# Patient Record
Sex: Male | Born: 1955 | Race: Black or African American | Hispanic: No | Smoking: Never smoker
Health system: Southern US, Community
[De-identification: ages and names within clinical notes are randomized; demographics above are authoritative.]

## PROBLEM LIST (undated history)

## (undated) DIAGNOSIS — Z524 Kidney donor: Secondary | ICD-10-CM

## (undated) DIAGNOSIS — T7840XA Allergy, unspecified, initial encounter: Secondary | ICD-10-CM

## (undated) HISTORY — DX: Allergy, unspecified, initial encounter: T78.40XA

## (undated) HISTORY — PX: NEPHRECTOMY: SHX65

## (undated) HISTORY — PX: TONSILLECTOMY: SUR1361

## (undated) HISTORY — PX: CERVICAL FUSION: SHX112

## (undated) HISTORY — DX: Kidney donor: Z52.4

---

## 1998-03-06 ENCOUNTER — Emergency Department (HOSPITAL_COMMUNITY): Admission: EM | Admit: 1998-03-06 | Discharge: 1998-03-06 | Payer: Self-pay | Admitting: Emergency Medicine

## 1999-07-02 ENCOUNTER — Encounter: Payer: Self-pay | Admitting: Emergency Medicine

## 1999-07-02 ENCOUNTER — Emergency Department (HOSPITAL_COMMUNITY): Admission: EM | Admit: 1999-07-02 | Discharge: 1999-07-03 | Payer: Self-pay | Admitting: Emergency Medicine

## 1999-07-03 ENCOUNTER — Encounter: Payer: Self-pay | Admitting: Emergency Medicine

## 2000-05-09 ENCOUNTER — Emergency Department (HOSPITAL_COMMUNITY): Admission: EM | Admit: 2000-05-09 | Discharge: 2000-05-09 | Payer: Self-pay | Admitting: Emergency Medicine

## 2000-08-12 ENCOUNTER — Encounter: Admission: RE | Admit: 2000-08-12 | Discharge: 2000-08-12 | Payer: Self-pay | Admitting: Emergency Medicine

## 2000-08-12 ENCOUNTER — Encounter: Payer: Self-pay | Admitting: Emergency Medicine

## 2002-05-01 ENCOUNTER — Encounter: Payer: Self-pay | Admitting: *Deleted

## 2002-05-01 ENCOUNTER — Ambulatory Visit (HOSPITAL_COMMUNITY): Admission: RE | Admit: 2002-05-01 | Discharge: 2002-05-01 | Payer: Self-pay | Admitting: *Deleted

## 2002-05-25 ENCOUNTER — Encounter: Payer: Self-pay | Admitting: *Deleted

## 2002-05-25 ENCOUNTER — Ambulatory Visit (HOSPITAL_COMMUNITY): Admission: RE | Admit: 2002-05-25 | Discharge: 2002-05-25 | Payer: Self-pay | Admitting: *Deleted

## 2002-06-19 ENCOUNTER — Emergency Department (HOSPITAL_COMMUNITY): Admission: EM | Admit: 2002-06-19 | Discharge: 2002-06-19 | Payer: Self-pay | Admitting: Emergency Medicine

## 2002-06-19 ENCOUNTER — Encounter: Payer: Self-pay | Admitting: Emergency Medicine

## 2004-08-21 ENCOUNTER — Ambulatory Visit (HOSPITAL_COMMUNITY): Admission: RE | Admit: 2004-08-21 | Discharge: 2004-08-21 | Payer: Self-pay | Admitting: Urology

## 2004-08-21 ENCOUNTER — Ambulatory Visit (HOSPITAL_BASED_OUTPATIENT_CLINIC_OR_DEPARTMENT_OTHER): Admission: RE | Admit: 2004-08-21 | Discharge: 2004-08-21 | Payer: Self-pay | Admitting: Urology

## 2005-10-10 ENCOUNTER — Encounter: Admission: RE | Admit: 2005-10-10 | Discharge: 2005-10-10 | Payer: Self-pay | Admitting: Emergency Medicine

## 2007-02-28 ENCOUNTER — Encounter: Admission: RE | Admit: 2007-02-28 | Discharge: 2007-02-28 | Payer: Self-pay | Admitting: Emergency Medicine

## 2007-03-13 ENCOUNTER — Encounter: Admission: RE | Admit: 2007-03-13 | Discharge: 2007-03-13 | Payer: Self-pay | Admitting: Emergency Medicine

## 2007-04-28 ENCOUNTER — Ambulatory Visit (HOSPITAL_COMMUNITY): Admission: RE | Admit: 2007-04-28 | Discharge: 2007-04-30 | Payer: Self-pay | Admitting: Neurosurgery

## 2007-06-19 ENCOUNTER — Encounter: Admission: RE | Admit: 2007-06-19 | Discharge: 2007-06-19 | Payer: Self-pay | Admitting: Neurosurgery

## 2009-06-02 ENCOUNTER — Emergency Department (HOSPITAL_COMMUNITY): Admission: EM | Admit: 2009-06-02 | Discharge: 2009-06-02 | Payer: Self-pay | Admitting: Emergency Medicine

## 2010-04-11 ENCOUNTER — Emergency Department (HOSPITAL_COMMUNITY): Admission: EM | Admit: 2010-04-11 | Discharge: 2010-04-12 | Payer: Self-pay | Admitting: Emergency Medicine

## 2010-09-28 LAB — URINALYSIS, ROUTINE W REFLEX MICROSCOPIC
Nitrite: NEGATIVE
Protein, ur: NEGATIVE mg/dL
Specific Gravity, Urine: 1.024 (ref 1.005–1.030)

## 2010-10-18 LAB — BASIC METABOLIC PANEL
BUN: 11 mg/dL (ref 6–23)
CO2: 25 mEq/L (ref 19–32)
Calcium: 9.2 mg/dL (ref 8.4–10.5)
Chloride: 103 mEq/L (ref 96–112)
Creatinine, Ser: 1.18 mg/dL (ref 0.4–1.5)
GFR calc Af Amer: >60 mL/min (ref >60)
GFR calc non Af Amer: >60 mL/min (ref >60)
Glucose, Bld: 92 mg/dL (ref 70–99)
Potassium: 5.4 mEq/L — ABNORMAL HIGH (ref 3.5–5.1)

## 2010-10-18 LAB — CBC
Platelets: 164 10*3/uL (ref 150–400)
RDW: 14.2 % (ref 11.5–15.5)

## 2010-10-18 LAB — POCT CARDIAC MARKERS
CKMB, poc: 2 ng/mL (ref 1.0–8.0)
Troponin i, poc: <0.05 ng/mL (ref 0.00–0.09)

## 2010-11-28 NOTE — Op Note (Signed)
Kevin Howell, Kevin Howell NO.:  [id number]   MEDICAL RECORD NO.:  [id number]          PATIENT TYPE:  INP   LOCATION:  2899                         FACILITY:  [facility]   PHYSICIAN:  Kathaleen Maser. Pool, M.D.    DATE OF BIRTH:  [birth year]   DATE OF PROCEDURE:  04/28/2007  DATE OF DISCHARGE:                               OPERATIVE REPORT   SERVICE:  Neurosurgery.   PREOPERATIVE DIAGNOSES:  Cervical stenosis with myelopathy.   POSTOPERATIVE DIAGNOSES:  Cervical stenosis with myelopathy.   PROCEDURE NOTE:  C3-4, C4-5, C5-6 and C6-7 anterior cervical diskectomy  and fusion with allograft and plating.   SURGEON:  Kathaleen Maser. Pool, M.D.   ASSISTANT:  Donalee Citrin, M.D.   ANESTHESIA:  General orotracheal.   INDICATIONS FOR PROCEDURE:  Kevin Howell is a 55 year old male with a  history of neck pain and some vague upper and lower extremity symptoms.  Workup demonstrates evidence of severe multilevel cervical stenosis with  spinal cord signal abnormality. The patient had been counseled as to his  options.  He decided to proceed with multilevel anterior cervical  decompression and fusion with instrumentation.   DESCRIPTION OF PROCEDURE:  The patient was placed on the operating table  in supine position. After an adequate level of anesthesia had been  achieved, the patient was positioned supine with the neck slightly  extended, held in place with halter traction.  The patient's anterior  cervical region was prepped and draped sterilely.  A 10 blade was used  to make a linear incision extending from C3 down to C7.  This was  carried down sharply to the platysma. The platysma was then divided  vertically and dissection proceeded on the medial border of the  sternomastoid muscle and carotid sheath. The trachea and esophagus were  mobilized and tracked towards the left. The prevertebral fascia was  stripped off the anterior spinal column.  The longus coli was then  elevated bilaterally  using electrocautery.  A deep self-retaining  retractor was placed. Intraoperative fluoroscopy views in the C3-4, 4-5,  5-6 and 6-7 levels were confirmed.  The disk space at all levels were  incised with a 15 blade in a rectangular fashion. Wide disk space clean  out achieved using the pituitary rongeurs, forward and backward Carlens  curettes, Kerrison rongeurs, high-speed drill. All elements of the disk  were removed down to the level of the posterior annulus.  Starting first  at C3-4, the remaining aspects of the annulus and osteophytes were  removed using the high-speed drill down to the level of the posterior  longitudinal ligament, the posterior longitudinal ligament was then  elevated and resected in the fashion using Kerrison rongeurs. A wide  central decompression was then performed by undercutting the bodies of  C3 and C4.  Decompression then proceeded out each neural foramen.  Wide  anterior foraminotomies were then performed along the course of the  exiting C4 nerve roots bilaterally.  At this point a very thorough  decompression had been achieved.  There was no evidence of injury to the  thecal sac or nerve  roots.  Decompression then performed at C4-5, C5-6  and C6-7 in a similar fashion and at all levels there was no evidence of  complicating features.  All levels were then irrigated clean.  Hemostasis was ensured with Gelfoam. LifeNet allograft wedge was then  placed at 3-4, 4-5, 5-6 and 67.  All grafts were then impacted into  place and recessed approximately 1 mm from the anterior cortical margin.  An 80 mm Atlantis anterior cervical plate was then placed over the C3-C7  levels.  This was then attached under fluoroscopic guidance using 13 mm  variable screws,  two each at all five levels.  All screws were given  final tightening and found to be solid and plumb.A locking screw was  engaged at all levels.  Final images revealed good position of the bone  grafts and hardware,   proper __________ of the lumbar spine. The wound  was then irrigated with antibiotic solution.  Gelfoam was placed  topically and  hemostasis found be good.  The wound was then closed in  typical fashion.  Steri-Strips and sterile dressing were applied. There  were  no complication. He tolerated the procedure well and he returns to  the recovery room postop.           ______________________________  Kathaleen Maser. Pool, M.D.     HAP/MEDQ  D:  04/28/2007  T:  04/28/2007  Job:  [id number]

## 2010-12-01 NOTE — Op Note (Signed)
NAME:  Kevin Howell, Kevin Howell NO.:  [id number]   MEDICAL RECORD NO.:  [id number]          PATIENT TYPE:  AMB   LOCATION:  [facility]                         FACILITY:  [facility]   PHYSICIAN:  Bertram Millard. Dahlstedt, M.D.DATE OF BIRTH:  1956/05/29   DATE OF PROCEDURE:  08/21/2004  DATE OF DISCHARGE:                                 OPERATIVE REPORT   PREOPERATIVE DIAGNOSIS:  Phimosis.   POSTOPERATIVE DIAGNOSIS:  Phimosis.   PROCEDURE:  Circumcision.   SURGEON:  Bertram Millard. Dahlstedt, M.D.   ANESTHESIA:  General.   COMPLICATIONS:  None.   BRIEF HISTORY:  A 55 year old male who has presented with some difficulty  with his foreskin over the past several months. He has been treated with  topical steroids. Despite this, he says mild phimosis but also recurrent  balanitis. He presents at this time for circumcision at his request. He is  aware of risks and complications and desires to proceed.   DESCRIPTION OF PROCEDURE:  The patient was administered preoperative IV  antibiotics and taken to the operating room where general anesthetic was  administered. His genitalia were prepped and draped. A 15 mL of 0.5% plain  Marcaine was used to perform a dorsal penile block. Proximal and distal  circumcising incisions were then made and the foreskin was excised. Small  bleeders underneath were electrocoagulated. The incision was closed. First,  a U stitch was placed at the frenulum. Quadrant sutures of the same 4-0  chromic were placed in simple fashion. In between the 4-0 chromic was used  to run the wound closed in a simple running fashion. The usual dressings  were placed.   The patient tolerated procedure well. He was awakened and  taken to PACU in  stable condition.   He will follow up in two to three weeks. Discharge instructions were given  as well as prescription for Vicodin #20.      SMD/MEDQ  D:  08/21/2004  T:  08/21/2004  Job:  [id number]

## 2011-04-26 LAB — DIFFERENTIAL
Lymphocytes Relative: 24
Lymphs Abs: 1.7
Monocytes Absolute: 0.5
Monocytes Relative: 7
Neutro Abs: 4.5
Neutrophils Relative %: 64

## 2011-04-26 LAB — CBC
MCHC: 33.1
Platelets: 182
RDW: 14.3 — ABNORMAL HIGH

## 2011-04-26 LAB — TYPE AND SCREEN: ABO/RH(D): A POS

## 2011-04-26 LAB — ABO/RH: ABO/RH(D): A POS

## 2011-11-23 ENCOUNTER — Other Ambulatory Visit: Payer: Self-pay | Admitting: Neurosurgery

## 2011-11-23 DIAGNOSIS — M545 Low back pain, unspecified: Secondary | ICD-10-CM

## 2011-11-28 ENCOUNTER — Ambulatory Visit
Admission: RE | Admit: 2011-11-28 | Discharge: 2011-11-28 | Disposition: A | Discharge status: Home / Self Care | Payer: 59 | Source: Ambulatory Visit | Attending: Neurosurgery | Admitting: Neurosurgery

## 2011-11-28 DIAGNOSIS — M545 Low back pain, unspecified: Secondary | ICD-10-CM

## 2012-01-08 ENCOUNTER — Other Ambulatory Visit: Payer: Self-pay | Admitting: Neurosurgery

## 2012-02-19 ENCOUNTER — Encounter (HOSPITAL_COMMUNITY): Payer: Self-pay | Admitting: Pharmacy Technician

## 2012-02-19 ENCOUNTER — Encounter (HOSPITAL_COMMUNITY)
Admission: RE | Admit: 2012-02-19 | Discharge: 2012-02-19 | Disposition: A | Discharge status: Home / Self Care | Payer: 59 | Source: Ambulatory Visit | Attending: Neurosurgery | Admitting: Neurosurgery

## 2012-02-19 ENCOUNTER — Encounter (HOSPITAL_COMMUNITY): Payer: Self-pay

## 2012-02-19 LAB — CBC
Hemoglobin: 15.8 g/dL (ref 13.0–17.0)
MCHC: 34.1 g/dL (ref 30.0–36.0)
WBC: 6.7 10*3/uL (ref 4.0–10.5)

## 2012-02-19 LAB — BASIC METABOLIC PANEL
BUN: 8 mg/dL (ref 6–23)
CO2: 28 mEq/L (ref 19–32)
Chloride: 103 mEq/L (ref 96–112)
Creatinine, Ser: 1.31 mg/dL (ref 0.50–1.35)
Glucose, Bld: 90 mg/dL (ref 70–99)

## 2012-02-19 LAB — TYPE AND SCREEN: Antibody Screen: NEGATIVE

## 2012-02-19 LAB — DIFFERENTIAL
Basophils Absolute: 0 10*3/uL (ref 0.0–0.1)
Basophils Relative: 0 % (ref 0–1)
Monocytes Relative: 7 % (ref 3–12)
Neutro Abs: 4.1 10*3/uL (ref 1.7–7.7)
Neutrophils Relative %: 61 % (ref 43–77)

## 2012-02-19 LAB — SURGICAL PCR SCREEN: MRSA, PCR: NEGATIVE

## 2012-02-19 NOTE — Pre-Procedure Instructions (Signed)
20 [name]  02/19/2012   Your procedure is scheduled on:  Tuesday  03/04/12  Report to [facility] at 530 AM.  Call this number if you have problems the morning of surgery: [phone]   Remember:   Do not eat food OR DRINK:After Midnight.   Take these medicines the morning of surgery with A SIP OF WATER:   NONE   Do not wear jewelry, make-up or nail polish.  Do not wear lotions, powders, or perfumes. You may wear deodorant.  Do not shave 48 hours prior to surgery. Men may shave face and neck.  Do not bring valuables to the hospital.  Contacts, dentures or bridgework may not be worn into surgery.  Leave suitcase in the car. After surgery it may be brought to your room.  For patients admitted to the hospital, checkout time is 11:00 AM the day of discharge.   Patients discharged the day of surgery will not be allowed to drive home.  Name and phone number of your driver:   Special Instructions: CHG Shower Use Special Wash: 1/2 bottle night before surgery and 1/2 bottle morning of surgery.   Please read over the following fact sheets that you were given: Pain Booklet, Coughing and Deep Breathing, MRSA Information and Surgical Site Infection Prevention

## 2012-03-03 MED ORDER — DEXAMETHASONE SODIUM PHOSPHATE 10 MG/ML IJ SOLN: 10.0000 mg | INTRAMUSCULAR | Status: DC

## 2012-03-03 MED ORDER — DEXTROSE 5 % IV SOLN
2.0000 g | INTRAVENOUS | Status: AC
  Administered 2012-03-04: 2 g via INTRAVENOUS

## 2012-03-04 ENCOUNTER — Encounter (HOSPITAL_COMMUNITY): Payer: Self-pay | Admitting: Anesthesiology

## 2012-03-04 ENCOUNTER — Ambulatory Visit (HOSPITAL_COMMUNITY): Payer: 59

## 2012-03-04 ENCOUNTER — Encounter (HOSPITAL_COMMUNITY)
Admission: RE | Disposition: A | Discharge status: Home / Self Care | Payer: Self-pay | Source: Ambulatory Visit | Attending: Neurosurgery

## 2012-03-04 ENCOUNTER — Encounter (HOSPITAL_COMMUNITY): Payer: Self-pay | Admitting: *Deleted

## 2012-03-04 ENCOUNTER — Ambulatory Visit (HOSPITAL_COMMUNITY): Payer: 59 | Admitting: Anesthesiology

## 2012-03-04 ENCOUNTER — Encounter (HOSPITAL_COMMUNITY): Payer: Self-pay | Admitting: Neurosurgery

## 2012-03-04 ENCOUNTER — Ambulatory Visit (HOSPITAL_COMMUNITY)
Admission: RE | Admit: 2012-03-04 | Discharge: 2012-03-05 | Disposition: A | Discharge status: Home / Self Care | Payer: 59 | Source: Ambulatory Visit | Attending: Neurosurgery | Admitting: Neurosurgery

## 2012-03-04 DIAGNOSIS — Z981 Arthrodesis status: Secondary | ICD-10-CM | POA: Insufficient documentation

## 2012-03-04 DIAGNOSIS — Z01812 Encounter for preprocedural laboratory examination: Secondary | ICD-10-CM | POA: Insufficient documentation

## 2012-03-04 DIAGNOSIS — M48062 Spinal stenosis, lumbar region with neurogenic claudication: Secondary | ICD-10-CM | POA: Diagnosis present

## 2012-03-04 HISTORY — PX: LUMBAR LAMINECTOMY/DECOMPRESSION MICRODISCECTOMY: SHX5026

## 2012-03-04 SURGERY — LUMBAR LAMINECTOMY/DECOMPRESSION MICRODISCECTOMY 2 LEVELS
Anesthesia: General | Site: Back | Wound class: Clean

## 2012-03-04 MED ORDER — ACETAMINOPHEN 325 MG PO TABS: 650.0000 mg | ORAL_TABLET | ORAL | Status: DC | PRN

## 2012-03-04 MED ORDER — HYDROCODONE-ACETAMINOPHEN 5-325 MG PO TABS: 1.0000 | ORAL_TABLET | ORAL | Status: DC | PRN

## 2012-03-04 MED ORDER — BACITRACIN 50000 UNITS IM SOLR
INTRAMUSCULAR | Status: AC
  Filled 2012-03-04: qty 1

## 2012-03-04 MED ORDER — CYCLOBENZAPRINE HCL 10 MG PO TABS
10.0000 mg | ORAL_TABLET | Freq: Three times a day (TID) | ORAL | Status: DC | PRN
  Administered 2012-03-05: 10 mg via ORAL
  Filled 2012-03-04: qty 1

## 2012-03-04 MED ORDER — LACTATED RINGERS IV SOLN
INTRAVENOUS | Status: DC | PRN
  Administered 2012-03-04 (×2): via INTRAVENOUS

## 2012-03-04 MED ORDER — SENNA 8.6 MG PO TABS
1.0000 | ORAL_TABLET | Freq: Two times a day (BID) | ORAL | Status: DC
  Administered 2012-03-04 – 2012-03-05 (×2): 8.6 mg via ORAL
  Filled 2012-03-04 (×3): qty 1

## 2012-03-04 MED ORDER — CEFAZOLIN SODIUM-DEXTROSE 2-3 GM-% IV SOLR
INTRAVENOUS | Status: AC
  Filled 2012-03-04: qty 50

## 2012-03-04 MED ORDER — FENTANYL CITRATE 0.05 MG/ML IJ SOLN
INTRAMUSCULAR | Status: DC | PRN
  Administered 2012-03-04: 50 ug via INTRAVENOUS
  Administered 2012-03-04: 100 ug via INTRAVENOUS

## 2012-03-04 MED ORDER — ZOLPIDEM TARTRATE 5 MG PO TABS: 5.0000 mg | ORAL_TABLET | Freq: Every evening | ORAL | Status: DC | PRN

## 2012-03-04 MED ORDER — ACETAMINOPHEN 650 MG RE SUPP: 650.0000 mg | RECTAL | Status: DC | PRN

## 2012-03-04 MED ORDER — BUPIVACAINE HCL (PF) 0.25 % IJ SOLN
INTRAMUSCULAR | Status: DC | PRN
  Administered 2012-03-04: 20 mL

## 2012-03-04 MED ORDER — ACETAMINOPHEN 10 MG/ML IV SOLN
INTRAVENOUS | Status: AC
  Filled 2012-03-04: qty 100

## 2012-03-04 MED ORDER — 0.9 % SODIUM CHLORIDE (POUR BTL) OPTIME
TOPICAL | Status: DC | PRN
  Administered 2012-03-04: 1000 mL

## 2012-03-04 MED ORDER — NEOSTIGMINE METHYLSULFATE 1 MG/ML IJ SOLN
INTRAMUSCULAR | Status: DC | PRN
  Administered 2012-03-04: 5 mg via INTRAVENOUS

## 2012-03-04 MED ORDER — HYDROMORPHONE HCL PF 1 MG/ML IJ SOLN
INTRAMUSCULAR | Status: AC
  Filled 2012-03-04: qty 1

## 2012-03-04 MED ORDER — MIDAZOLAM HCL 5 MG/5ML IJ SOLN
INTRAMUSCULAR | Status: DC | PRN
  Administered 2012-03-04: 2 mg via INTRAVENOUS

## 2012-03-04 MED ORDER — ONDANSETRON HCL 4 MG/2ML IJ SOLN: 4.0000 mg | INTRAMUSCULAR | Status: DC | PRN

## 2012-03-04 MED ORDER — SODIUM CHLORIDE 0.9 % IJ SOLN: 3.0000 mL | INTRAMUSCULAR | Status: DC | PRN

## 2012-03-04 MED ORDER — DEXAMETHASONE SODIUM PHOSPHATE 10 MG/ML IJ SOLN
INTRAMUSCULAR | Status: AC
  Administered 2012-03-04: 10 mg via INTRAVENOUS
  Filled 2012-03-04: qty 1

## 2012-03-04 MED ORDER — THROMBIN 5000 UNITS EX KIT
PACK | CUTANEOUS | Status: DC | PRN
  Administered 2012-03-04 (×2): 5000 [IU] via TOPICAL

## 2012-03-04 MED ORDER — ROCURONIUM BROMIDE 100 MG/10ML IV SOLN
INTRAVENOUS | Status: DC | PRN
  Administered 2012-03-04: 50 mg via INTRAVENOUS
  Administered 2012-03-04: 10 mg via INTRAVENOUS

## 2012-03-04 MED ORDER — ONDANSETRON HCL 4 MG/2ML IJ SOLN
INTRAMUSCULAR | Status: DC | PRN
  Administered 2012-03-04: 4 mg via INTRAVENOUS

## 2012-03-04 MED ORDER — MENTHOL 3 MG MT LOZG: 1.0000 | LOZENGE | OROMUCOSAL | Status: DC | PRN

## 2012-03-04 MED ORDER — ACETAMINOPHEN 10 MG/ML IV SOLN
INTRAVENOUS | Status: DC | PRN
  Administered 2012-03-04: 1000 mg via INTRAVENOUS

## 2012-03-04 MED ORDER — HYDROMORPHONE HCL PF 1 MG/ML IJ SOLN
0.2500 mg | INTRAMUSCULAR | Status: DC | PRN
  Administered 2012-03-04 (×4): 0.5 mg via INTRAVENOUS

## 2012-03-04 MED ORDER — SODIUM CHLORIDE 0.9 % IJ SOLN
3.0000 mL | Freq: Two times a day (BID) | INTRAMUSCULAR | Status: DC
  Administered 2012-03-04 – 2012-03-05 (×3): 3 mL via INTRAVENOUS

## 2012-03-04 MED ORDER — LIDOCAINE HCL (CARDIAC) 20 MG/ML IV SOLN
INTRAVENOUS | Status: DC | PRN
  Administered 2012-03-04: 80 mg via INTRAVENOUS

## 2012-03-04 MED ORDER — PROPOFOL 10 MG/ML IV EMUL
INTRAVENOUS | Status: DC | PRN
  Administered 2012-03-04: 200 mg via INTRAVENOUS

## 2012-03-04 MED ORDER — HEMOSTATIC AGENTS (NO CHARGE) OPTIME
TOPICAL | Status: DC | PRN
  Administered 2012-03-04: 1 via TOPICAL

## 2012-03-04 MED ORDER — ASPIRIN EC 325 MG PO TBEC
325.0000 mg | DELAYED_RELEASE_TABLET | Freq: Every day | ORAL | Status: DC | PRN
  Filled 2012-03-04: qty 1

## 2012-03-04 MED ORDER — CEFAZOLIN SODIUM 1-5 GM-% IV SOLN
1.0000 g | Freq: Three times a day (TID) | INTRAVENOUS | Status: AC
  Administered 2012-03-04 (×2): 1 g via INTRAVENOUS
  Filled 2012-03-04 (×2): qty 50

## 2012-03-04 MED ORDER — ALUM & MAG HYDROXIDE-SIMETH 200-200-20 MG/5ML PO SUSP: 30.0000 mL | Freq: Four times a day (QID) | ORAL | Status: DC | PRN

## 2012-03-04 MED ORDER — ONDANSETRON HCL 4 MG/2ML IJ SOLN: 4.0000 mg | Freq: Once | INTRAMUSCULAR | Status: DC | PRN

## 2012-03-04 MED ORDER — PHENOL 1.4 % MT LIQD: 1.0000 | OROMUCOSAL | Status: DC | PRN

## 2012-03-04 MED ORDER — GLYCOPYRROLATE 0.2 MG/ML IJ SOLN
INTRAMUSCULAR | Status: DC | PRN
  Administered 2012-03-04: .8 mg via INTRAVENOUS

## 2012-03-04 MED ORDER — SODIUM CHLORIDE 0.9 % IR SOLN
Status: DC | PRN
  Administered 2012-03-04: 09:00:00

## 2012-03-04 MED ORDER — SODIUM CHLORIDE 0.9 % IV SOLN
INTRAVENOUS | Status: AC
  Filled 2012-03-04: qty 500

## 2012-03-04 MED ORDER — SODIUM CHLORIDE 0.9 % IV SOLN: 250.0000 mL | INTRAVENOUS | Status: DC

## 2012-03-04 MED ORDER — HYDROMORPHONE HCL PF 1 MG/ML IJ SOLN
0.5000 mg | INTRAMUSCULAR | Status: DC | PRN
  Administered 2012-03-04: 1 mg via INTRAVENOUS
  Filled 2012-03-04: qty 1

## 2012-03-04 MED ORDER — OXYCODONE-ACETAMINOPHEN 5-325 MG PO TABS
1.0000 | ORAL_TABLET | ORAL | Status: DC | PRN
  Administered 2012-03-04: 2 via ORAL
  Administered 2012-03-05: 1 via ORAL
  Filled 2012-03-04: qty 1
  Filled 2012-03-04: qty 2

## 2012-03-04 SURGICAL SUPPLY — 49 items
ADH SKN CLS APL DERMABOND .7 (GAUZE/BANDAGES/DRESSINGS) ×1
APL SKNCLS STERI-STRIP NONHPOA (GAUZE/BANDAGES/DRESSINGS) ×1
BAG DECANTER FOR FLEXI CONT (MISCELLANEOUS) ×2 IMPLANT
BENZOIN TINCTURE PRP APPL 2/3 (GAUZE/BANDAGES/DRESSINGS) ×2 IMPLANT
BLADE SURG ROTATE 9660 (MISCELLANEOUS) IMPLANT
BRUSH SCRUB EZ PLAIN DRY (MISCELLANEOUS) ×2 IMPLANT
BUR CUTTER 7.0 ROUND (BURR) ×2 IMPLANT
CANISTER SUCTION 2500CC (MISCELLANEOUS) ×2 IMPLANT
CLOTH BEACON ORANGE TIMEOUT ST (SAFETY) ×2 IMPLANT
CONT SPEC 4OZ CLIKSEAL STRL BL (MISCELLANEOUS) ×2 IMPLANT
DECANTER SPIKE VIAL GLASS SM (MISCELLANEOUS) ×1 IMPLANT
DERMABOND ADVANCED (GAUZE/BANDAGES/DRESSINGS) ×1
DERMABOND ADVANCED .7 DNX12 (GAUZE/BANDAGES/DRESSINGS) ×1 IMPLANT
DRAPE LAPAROTOMY 100X72X124 (DRAPES) ×2 IMPLANT
DRAPE MICROSCOPE ZEISS OPMI (DRAPES) ×2 IMPLANT
DRAPE POUCH INSTRU U-SHP 10X18 (DRAPES) ×2 IMPLANT
DRAPE PROXIMA HALF (DRAPES) IMPLANT
DRAPE SURG 17X23 STRL (DRAPES) ×4 IMPLANT
ELECT REM PT RETURN 9FT ADLT (ELECTROSURGICAL) ×2
ELECTRODE REM PT RTRN 9FT ADLT (ELECTROSURGICAL) ×1 IMPLANT
GAUZE SPONGE 4X4 16PLY XRAY LF (GAUZE/BANDAGES/DRESSINGS) IMPLANT
GLOVE BIOGEL PI IND STRL 7.0 (GLOVE) IMPLANT
GLOVE BIOGEL PI INDICATOR 7.0 (GLOVE) ×2
GLOVE ECLIPSE 6.5 STRL STRAW (GLOVE) ×1 IMPLANT
GLOVE ECLIPSE 8.5 STRL (GLOVE) ×2 IMPLANT
GLOVE EXAM NITRILE LRG STRL (GLOVE) IMPLANT
GLOVE EXAM NITRILE MD LF STRL (GLOVE) IMPLANT
GLOVE EXAM NITRILE XL STR (GLOVE) IMPLANT
GLOVE EXAM NITRILE XS STR PU (GLOVE) IMPLANT
GOWN BRE IMP SLV AUR LG STRL (GOWN DISPOSABLE) ×1 IMPLANT
GOWN BRE IMP SLV AUR XL STRL (GOWN DISPOSABLE) ×2 IMPLANT
GOWN STRL REIN 2XL LVL4 (GOWN DISPOSABLE) IMPLANT
KIT BASIN OR (CUSTOM PROCEDURE TRAY) ×2 IMPLANT
KIT ROOM TURNOVER OR (KITS) ×2 IMPLANT
NEEDLE HYPO 22GX1.5 SAFETY (NEEDLE) ×2 IMPLANT
NS IRRIG 1000ML POUR BTL (IV SOLUTION) ×2 IMPLANT
PACK LAMINECTOMY NEURO (CUSTOM PROCEDURE TRAY) ×2 IMPLANT
PAD ARMBOARD 7.5X6 YLW CONV (MISCELLANEOUS) ×6 IMPLANT
RUBBERBAND STERILE (MISCELLANEOUS) ×4 IMPLANT
SPONGE GAUZE 4X4 12PLY (GAUZE/BANDAGES/DRESSINGS) ×2 IMPLANT
SPONGE SURGIFOAM ABS GEL SZ50 (HEMOSTASIS) ×2 IMPLANT
STRIP CLOSURE SKIN 1/2X4 (GAUZE/BANDAGES/DRESSINGS) ×2 IMPLANT
SUT VIC AB 2-0 CT1 18 (SUTURE) ×2 IMPLANT
SUT VIC AB 3-0 SH 8-18 (SUTURE) ×2 IMPLANT
SYR 20ML ECCENTRIC (SYRINGE) ×2 IMPLANT
TAPE CLOTH SURG 4X10 WHT LF (GAUZE/BANDAGES/DRESSINGS) ×1 IMPLANT
TOWEL OR 17X24 6PK STRL BLUE (TOWEL DISPOSABLE) ×2 IMPLANT
TOWEL OR 17X26 10 PK STRL BLUE (TOWEL DISPOSABLE) ×2 IMPLANT
WATER STERILE IRR 1000ML POUR (IV SOLUTION) ×2 IMPLANT

## 2012-03-04 NOTE — Anesthesia Preprocedure Evaluation (Addendum)
Anesthesia Evaluation  Patient identified by MRN, date of birth, ID band Patient awake    Reviewed: Allergy & Precautions, H&P , NPO status , Patient's Chart, lab work & pertinent test results, reviewed documented beta blocker date and time   History of Anesthesia Complications Negative for: history of anesthetic complications  Airway Mallampati: II TM Distance: >3 FB Neck ROM: Limited    Dental  (+) Teeth Intact, Dental Advisory Given and Caps,    Pulmonary neg pulmonary ROS,          Cardiovascular negative cardio ROS      Neuro/Psych negative neurological ROS  negative psych ROS   GI/Hepatic negative GI ROS, Neg liver ROS,   Endo/Other  negative endocrine ROS  Renal/GU negative Renal ROS     Musculoskeletal negative musculoskeletal ROS (+)   Abdominal   Peds  Hematology negative hematology ROS (+)   Anesthesia Other Findings   Reproductive/Obstetrics                          Anesthesia Physical Anesthesia Plan  ASA: II  Anesthesia Plan: General   Post-op Pain Management:    Induction: Intravenous  Airway Management Planned: Oral ETT  Additional Equipment:   Intra-op Plan:   Post-operative Plan: Extubation in OR  Informed Consent: I have reviewed the patients History and Physical, chart, labs and discussed the procedure including the risks, benefits and alternatives for the proposed anesthesia with the patient or authorized representative who has indicated his/her understanding and acceptance.     Plan Discussed with: CRNA and Surgeon  Anesthesia Plan Comments:         Anesthesia Quick Evaluation

## 2012-03-04 NOTE — Plan of Care (Signed)
Problem: Consults Goal: Diagnosis - Spinal Surgery Outcome: Completed/Met Date Met:  03/04/12 Lumbar Laminectomy (Complex)

## 2012-03-04 NOTE — Transfer of Care (Signed)
Immediate Anesthesia Transfer of Care Note  Patient: Kevin Howell  Procedure(s) Performed: Procedure(s) (LRB): LUMBAR LAMINECTOMY/DECOMPRESSION MICRODISCECTOMY 2 LEVELS (N/A)  Patient Location: PACU  Anesthesia Type: General  Level of Consciousness: awake, alert  and oriented  Airway & Oxygen Therapy: Patient Spontanous Breathing and Patient connected to nasal cannula oxygen  Post-op Assessment: Report given to PACU RN and Post -op Vital signs reviewed and stable  Post vital signs: Reviewed and stable  Complications: No apparent anesthesia complications

## 2012-03-04 NOTE — H&P (Signed)
Kevin Howell is an 56 y.o. male.   Chief Complaint: Bilateral lower extremity pain HPI: 56 year old male with back and bilateral lower extremity pain left greater than right with symptoms consistent with neurogenic claudication is found to have significant lumbar stenosis at L3-4 and L4-5. Patient has failed conservative management and presents now for decompressive surgery. He has symptoms of numbness and paresthesias in both lower extremities. Claudications after short distances. No bowel or bladder complaints.  History reviewed. No pertinent past medical history.  Past Surgical History  Procedure Date  . Cervical fusion     4 YRS AGO  . Tonsillectomy     AGE 65   . 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 No pertinent family history. Social History:  reports that he has never smoked. He does not have any smokeless tobacco history on file. He reports that he does not drink alcohol or use illicit drugs.  Allergies:  Allergies  Allergen Reactions  . Advil (Ibuprofen)     RENAL FUNCT      Medications Prior to Admission  Medication Sig Dispense Refill  . aspirin EC 325 MG tablet Take 325 mg by mouth daily as needed. For pain        No results found for this or any previous visit (from the past 48 hour(s)). No results found.  Review of Systems  Constitutional: Negative.   HENT: Negative.   Eyes: Negative.   Respiratory: Negative.   Cardiovascular: Negative.   Gastrointestinal: Negative.   Genitourinary: Negative.   Musculoskeletal: Negative.   Skin: Negative.   Neurological: Negative.   Endo/Heme/Allergies: Negative.   Psychiatric/Behavioral: Negative.     Blood pressure 124/82, temperature 98.5 F (36.9 C), temperature source Oral, resp. rate 18, SpO2 98.00%. Physical Exam  Constitutional: He is oriented to person, place, and time. He appears well-developed and well-nourished.  HENT:  Head: Normocephalic and atraumatic.  Right Ear: External ear  normal.  Left Ear: External ear normal.  Nose: Nose normal.  Mouth/Throat: Oropharynx is clear and moist.  Eyes: Conjunctivae and EOM are normal. Pupils are equal, round, and reactive to light. Right eye exhibits no discharge. Left eye exhibits no discharge.  Neck: Normal range of motion. Neck supple. No tracheal deviation present. No thyromegaly present.  Cardiovascular: Normal rate, regular rhythm and intact distal pulses.  Exam reveals no friction rub.   No murmur heard. Respiratory: Effort normal and breath sounds normal. No respiratory distress. He has no wheezes.  GI: Soft. Bowel sounds are normal. He exhibits no distension. There is no tenderness.  Musculoskeletal: Normal range of motion. He exhibits no edema and no tenderness.  Neurological: He is alert and oriented to person, place, and time. He has normal reflexes. He displays normal reflexes. No cranial nerve deficit. He exhibits normal muscle tone. Coordination normal.  Skin: Skin is warm and dry. No rash noted. No erythema. No pallor.  Psychiatric: He has a normal mood and affect. His behavior is normal. Judgment and thought content normal.     Assessment/Plan L3-4, L4-5 stenosis with neurogenic claudication. Plan L3-4 L4-5 decompressive laminectomy with foraminotomies. Risks and benefits have been explained. Patient wishes to proceed.  Jemaine Prokop A 03/04/2012, 7:43 AM

## 2012-03-04 NOTE — Anesthesia Postprocedure Evaluation (Signed)
Anesthesia Post Note  Patient: Kevin Howell  Procedure(s) Performed: Procedure(s) (LRB): LUMBAR LAMINECTOMY/DECOMPRESSION MICRODISCECTOMY 2 LEVELS (N/A)  Anesthesia type: general  Patient location: PACU  Post pain: Pain level controlled  Post assessment: Patient's Cardiovascular Status Stable  Last Vitals:  Filed Vitals:   03/04/12 1052  BP:   Pulse: 54  Temp:   Resp: 33    Post vital signs: Reviewed and stable  Level of consciousness: sedated  Complications: No apparent anesthesia complications

## 2012-03-04 NOTE — Op Note (Signed)
Date of procedure: 03/04/2012  Date of dictation: Same  Service: Neurosurgery  Preoperative diagnosis: L3-4, L4-5 stenosis with neurogenic claudication  Postoperative diagnosis: Same  Procedure Name: Bilateral L3-4 decompressive laminotomies with bilateral L3 and L4 decompressive foraminotomies.  Bilateral L4-5 decompressive laminotomies with bilateral L4 and L5 decompressive foraminotomies.  Surgeon:Deagan Sevin A.Emrah Ariola, M.D.  Asst. Surgeon: None  Anesthesia: General  Indication:  56 year old male with back and bilateral lower chamois symptoms consistent with neurogenic claudication her workup demonstrates evidence of significant stenosis at L3-4 and L4-5. Patient presents now for decompressive laminotomies and foraminotomies in hopes of improving his symptoms.  Operative note: After induction anesthesia, patient positioned prone on the Wilson frame appropriate padded patient lumbar regions prepped draped sterilely. It was medical meniscus incision overlying the L4 vertebral level. This carried down sharply and a subperiosteal dissection was performed performed so the lamina facet was L3-4 and 5. Deep self-retaining her placed intraoperative x-ray was used levels were confirmed. Bilateral laminotomies and foraminotomies and performed using high-speed drill and Kerrison rongeurs at L3-4 L4-5. Ligament flavum was elevated and resected using Kerrison rongeurs to complete the foraminotomies. There is no his injury to thecal sac and nerve roots. Wound is an area solution. Gelfoam postoperative hemostasis had a good. Microscope presses were removed. Hemostasis musculature for was and close in layers with Vicryl sutures. Steri-Strips triggers were applied. There were no apparent locations. Patient will and he returns recovery postop.

## 2012-03-04 NOTE — Preoperative (Signed)
Beta Blockers   Reason not to administer Beta Blockers:Not Applicable 

## 2012-03-04 NOTE — Brief Op Note (Signed)
03/04/2012  9:55 AM  PATIENT:  Kevin Howell  56 y.o. male  PRE-OPERATIVE DIAGNOSIS:  stenosis  POST-OPERATIVE DIAGNOSIS:  stenosis  PROCEDURE:  Procedure(s) (LRB): LUMBAR LAMINECTOMY/DECOMPRESSION MICRODISCECTOMY 2 LEVELS (N/A)  SURGEON:  Surgeon(s) and Role:    * Temple Pacini, MD - Primary  PHYSICIAN ASSISTANT:   ASSISTANTS:    ANESTHESIA:   general  EBL:  Total I/O In: 1450 [I.V.:1450] Out: 150 [Blood:150]  BLOOD ADMINISTERED:none  DRAINS: none   LOCAL MEDICATIONS USED:  MARCAINE     SPECIMEN:  No Specimen  DISPOSITION OF SPECIMEN:  N/A  COUNTS:  YES  TOURNIQUET:  * No tourniquets in log *  DICTATION: .Dragon Dictation  PLAN OF CARE: Admit for overnight observation  PATIENT DISPOSITION:  PACU - hemodynamically stable.   Delay start of Pharmacological VTE agent (>24hrs) due to surgical blood loss or risk of bleeding: yes

## 2012-03-05 ENCOUNTER — Encounter (HOSPITAL_COMMUNITY): Payer: Self-pay | Admitting: Neurosurgery

## 2012-03-05 MED ORDER — CYCLOBENZAPRINE HCL 10 MG PO TABS: 10.0000 mg | ORAL_TABLET | Freq: Three times a day (TID) | ORAL | Status: AC | PRN

## 2012-03-05 MED ORDER — HYDROCODONE-ACETAMINOPHEN 5-325 MG PO TABS: 1.0000 | ORAL_TABLET | ORAL | Status: AC | PRN

## 2012-03-05 NOTE — Discharge Summary (Signed)
Physician Discharge Summary  Patient ID: Kevin Howell MRN: [id number] DOB/AGE: 01/04/1956 56 y.o.  Admit date: 03/04/2012 Discharge date: 03/05/2012  Admission Diagnoses:  Discharge Diagnoses:  Principal Problem:  *Lumbar stenosis with neurogenic claudication   Discharged Condition: good  Hospital Course: Patient admitted the hospital oriented when an uncomplicated bilateral L3-4 L4-5 decompressive laminotomies and foraminotomies. He is doing well. Back and worsening pain stiffly better. Plan for discharge home. Consults:   Significant Diagnostic Studies:   Treatments:   Discharge Exam: Blood pressure 128/68, pulse 82, temperature 98.3 F (36.8 C), temperature source Oral, resp. rate 18, SpO2 94.00%. Awake and alert oriented and appropriate her cranial nerve function is intact. Motor and sensory function extremities normal. Wound clean dry and intact. Chest and abdomen benign.  Disposition:    Medication List  As of 03/05/2012  9:48 AM   TAKE these medications         aspirin EC 325 MG tablet   Take 325 mg by mouth daily as needed. For pain      cyclobenzaprine 10 MG tablet   Commonly known as: FLEXERIL   Take 1 tablet (10 mg total) by mouth 3 (three) times daily as needed for muscle spasms.      HYDROcodone-acetaminophen 5-325 MG per tablet   Commonly known as: NORCO/VICODIN   Take 1-2 tablets by mouth every 4 (four) hours as needed.           Follow-up Information    Follow up with Issai Werling A, MD. Call in 1 week.   Contact information:   [address] [place] [address] [phone]          Signed: Temple Pacini 03/05/2012, 9:48 AM

## 2012-03-10 ENCOUNTER — Encounter (HOSPITAL_COMMUNITY): Payer: Self-pay

## 2017-06-03 DIAGNOSIS — Z940 Kidney transplant status: Secondary | ICD-10-CM | POA: Diagnosis not present

## 2017-06-03 DIAGNOSIS — Z0000 Encounter for general adult medical examination without abnormal findings: Secondary | ICD-10-CM | POA: Diagnosis not present

## 2017-06-03 DIAGNOSIS — E0590 Thyrotoxicosis, unspecified without thyrotoxic crisis or storm: Secondary | ICD-10-CM | POA: Diagnosis not present

## 2017-06-03 DIAGNOSIS — N182 Chronic kidney disease, stage 2 (mild): Secondary | ICD-10-CM | POA: Diagnosis not present

## 2017-06-19 DIAGNOSIS — N182 Chronic kidney disease, stage 2 (mild): Secondary | ICD-10-CM | POA: Diagnosis not present

## 2017-06-19 DIAGNOSIS — Z0000 Encounter for general adult medical examination without abnormal findings: Secondary | ICD-10-CM | POA: Diagnosis not present

## 2017-06-19 DIAGNOSIS — E0590 Thyrotoxicosis, unspecified without thyrotoxic crisis or storm: Secondary | ICD-10-CM | POA: Diagnosis not present

## 2018-06-16 DIAGNOSIS — N182 Chronic kidney disease, stage 2 (mild): Secondary | ICD-10-CM | POA: Diagnosis not present

## 2018-06-16 DIAGNOSIS — E0590 Thyrotoxicosis, unspecified without thyrotoxic crisis or storm: Secondary | ICD-10-CM | POA: Diagnosis not present

## 2018-06-16 DIAGNOSIS — Z125 Encounter for screening for malignant neoplasm of prostate: Secondary | ICD-10-CM | POA: Diagnosis not present

## 2018-06-16 DIAGNOSIS — Z0000 Encounter for general adult medical examination without abnormal findings: Secondary | ICD-10-CM | POA: Diagnosis not present

## 2018-06-23 DIAGNOSIS — Z23 Encounter for immunization: Secondary | ICD-10-CM | POA: Diagnosis not present

## 2018-06-23 DIAGNOSIS — N182 Chronic kidney disease, stage 2 (mild): Secondary | ICD-10-CM | POA: Diagnosis not present

## 2018-06-23 DIAGNOSIS — Z0000 Encounter for general adult medical examination without abnormal findings: Secondary | ICD-10-CM | POA: Diagnosis not present

## 2018-06-23 DIAGNOSIS — E0590 Thyrotoxicosis, unspecified without thyrotoxic crisis or storm: Secondary | ICD-10-CM | POA: Diagnosis not present

## 2018-07-24 DIAGNOSIS — E0590 Thyrotoxicosis, unspecified without thyrotoxic crisis or storm: Secondary | ICD-10-CM | POA: Diagnosis not present

## 2018-07-24 DIAGNOSIS — Z6828 Body mass index (BMI) 28.0-28.9, adult: Secondary | ICD-10-CM | POA: Diagnosis not present

## 2018-07-24 DIAGNOSIS — I10 Essential (primary) hypertension: Secondary | ICD-10-CM | POA: Diagnosis not present

## 2018-10-14 DIAGNOSIS — Z6828 Body mass index (BMI) 28.0-28.9, adult: Secondary | ICD-10-CM | POA: Diagnosis not present

## 2018-10-14 DIAGNOSIS — I10 Essential (primary) hypertension: Secondary | ICD-10-CM | POA: Diagnosis not present

## 2018-10-14 DIAGNOSIS — E0590 Thyrotoxicosis, unspecified without thyrotoxic crisis or storm: Secondary | ICD-10-CM | POA: Diagnosis not present

## 2020-11-08 DIAGNOSIS — M5416 Radiculopathy, lumbar region: Secondary | ICD-10-CM | POA: Diagnosis not present

## 2020-11-13 ENCOUNTER — Emergency Department (HOSPITAL_COMMUNITY): Payer: Medicare Other

## 2020-11-13 ENCOUNTER — Emergency Department (HOSPITAL_COMMUNITY)
Admission: EM | Admit: 2020-11-13 | Discharge: 2020-11-13 | Disposition: A | Discharge status: Home / Self Care | Payer: Medicare Other | Attending: Emergency Medicine | Admitting: Emergency Medicine

## 2020-11-13 ENCOUNTER — Encounter (HOSPITAL_COMMUNITY): Payer: Self-pay | Admitting: Emergency Medicine

## 2020-11-13 DIAGNOSIS — Z7982 Long term (current) use of aspirin: Secondary | ICD-10-CM | POA: Diagnosis not present

## 2020-11-13 DIAGNOSIS — M5416 Radiculopathy, lumbar region: Secondary | ICD-10-CM | POA: Insufficient documentation

## 2020-11-13 DIAGNOSIS — M25552 Pain in left hip: Secondary | ICD-10-CM | POA: Diagnosis not present

## 2020-11-13 DIAGNOSIS — M545 Low back pain, unspecified: Secondary | ICD-10-CM | POA: Diagnosis not present

## 2020-11-13 DIAGNOSIS — M25559 Pain in unspecified hip: Secondary | ICD-10-CM

## 2020-11-13 MED ORDER — OXYCODONE-ACETAMINOPHEN 5-325 MG PO TABS
1.0000 | ORAL_TABLET | Freq: Once | ORAL | Status: AC
  Administered 2020-11-13: 1 via ORAL
  Filled 2020-11-13: qty 1

## 2020-11-13 NOTE — ED Notes (Signed)
ED RN reviewed discharge instructions. Follow up care and pain management reviewed, pt had no further questions

## 2020-11-13 NOTE — ED Notes (Signed)
Patient transported to MRI 

## 2020-11-13 NOTE — ED Provider Notes (Signed)
[facility] EMERGENCY DEPARTMENT Provider Note   CSN: [id number] Arrival date & time: 11/13/20  1509     History Chief Complaint  Patient presents with  . Hip Pain    Kevin Howell is a 65 y.o. male with a history of lumbar stenosis with neurogenic claudication, cervical fusion 4 years prior, lumbar laminectomy/decompression.  Patient presents with chief complaint of left lumbar pain with radiation to his left leg.  Patient reports he has history of lumbar back pain however radiation to his left leg is new.  This symptoms started over the last 2 weeks.  Patient describes his pain as a burning sensation.  Patient rates his pain 6/10 on the pain scale with pain worsening to 10/10 on the pain scale with movement.  Patient denies any recent falls or injuries.  Patient endorses is numbness to lateral left thigh and weakness to left leg.  Patient denies saddle anesthesia, bowel or bladder dysfunction, fevers, chills, neck pain, lightheadedness, dizziness, syncopal episode.  Reports that he was seen by his primary care provider on 4/26 and received 2 injections as well as being started on medications.  Per chart review patient appears to be treated for lumbar radiculopathy.  Unable to see providers note for details on this visit.  HPI     History reviewed. No pertinent past medical history.  Patient Active Problem List   Diagnosis Date Noted  . Lumbar stenosis with neurogenic claudication 03/04/2012    Past Surgical History:  Procedure Laterality Date  . CERVICAL FUSION     4 YRS AGO  . LUMBAR LAMINECTOMY/DECOMPRESSION MICRODISCECTOMY  03/04/2012   Procedure: LUMBAR LAMINECTOMY/DECOMPRESSION MICRODISCECTOMY 2 LEVELS;  Surgeon: Temple Pacini, MD;  Location: [facility];  Service: Neurosurgery;  Laterality: N/A;  Lumbar Laminotomies/Decompression Lumbar Three-Four, Lumbar Four-Five   . NEPHRECTOMY     GIVEN T SISTER   . TONSILLECTOMY     AGE 49        No family  history on file.  Social History   Tobacco Use  . Smoking status: Never Smoker  Substance Use Topics  . Alcohol use: No  . Drug use: No    Home Medications Prior to Admission medications   Medication Sig Start Date End Date Taking? Authorizing Provider  aspirin EC 325 MG tablet Take 325 mg by mouth daily as needed. For pain    [provider]    Allergies    Advil [ibuprofen]  Review of Systems   Review of Systems  Constitutional: Negative for chills and fever.  Eyes: Negative for visual disturbance.  Respiratory: Negative for shortness of breath.   Cardiovascular: Negative for chest pain and leg swelling.  Gastrointestinal: Negative for abdominal pain, nausea and vomiting.  Genitourinary: Negative for difficulty urinating.  Musculoskeletal: Positive for back pain and myalgias. Negative for neck pain and neck stiffness.  Skin: Negative for color change and rash.  Neurological: Positive for weakness and numbness. Negative for dizziness, tremors, seizures, syncope, facial asymmetry, speech difficulty, light-headedness and headaches.  Psychiatric/Behavioral: Negative for confusion.    Physical Exam Updated Vital Signs BP (!) 162/92 (BP Location: Left Arm)   Pulse 87   Temp 98.2 F (36.8 C) (Oral)   Resp 18   SpO2 100%   Physical Exam Vitals and nursing note reviewed.  Constitutional:      General: He is not in acute distress.    Appearance: He is not ill-appearing, toxic-appearing or diaphoretic.     Comments: Uncomfortable  due to complaints of pain  Eyes:     General: No scleral icterus.       Right eye: No discharge.        Left eye: No discharge.     Extraocular Movements: Extraocular movements intact.     Pupils: Pupils are equal, round, and reactive to light.  Cardiovascular:     Rate and Rhythm: Normal rate.  Pulmonary:     Effort: Pulmonary effort is normal. No respiratory distress.     Breath sounds: No stridor.  Abdominal:     General: There  is no distension. There are no signs of injury.     Palpations: Abdomen is soft. There is no mass or pulsatile mass.     Tenderness: There is no abdominal tenderness. There is no guarding or rebound.     Hernia: There is no hernia in the umbilical area or ventral area.  Musculoskeletal:     Cervical back: Normal range of motion and neck supple. Swelling present. No edema, deformity, erythema, signs of trauma, lacerations, rigidity, spasms, torticollis, tenderness, bony tenderness or crepitus. No pain with movement. Normal range of motion.     Thoracic back: No swelling, edema, deformity, signs of trauma, lacerations, spasms, tenderness or bony tenderness.     Lumbar back: Tenderness present. No swelling, edema, deformity, signs of trauma, lacerations, spasms or bony tenderness. Positive left straight leg raise test. Negative right straight leg raise test.     Right hip: No deformity, lacerations, tenderness or bony tenderness. Normal range of motion.     Left hip: Tenderness and bony tenderness present. No deformity or lacerations. Decreased range of motion.     Right upper leg: Normal.     Left upper leg: Tenderness present. No swelling, edema, deformity, lacerations or bony tenderness.     Left knee: No swelling, deformity, effusion, erythema, ecchymosis, lacerations or bony tenderness. Normal range of motion. Normal alignment.     Comments: No midline tenderness, deformity, or step-off to cervical, thoracic, or lumbar spine  Patient has tenderness to left lumbar back  Skin:    General: Skin is warm and dry.  Neurological:     General: No focal deficit present.     Mental Status: He is alert.     GCS: GCS eye subscore is 4. GCS verbal subscore is 5. GCS motor subscore is 6.     Cranial Nerves: No facial asymmetry.     Motor: No weakness or tremor.     Deep Tendon Reflexes:     Reflex Scores:      Patellar reflexes are 2+ on the right side and 2+ on the left side.    Comments: Performed  in supine position, +5 strength to bilateral upper extremities, +5 strength to dorsiflexion and plantarflexion, patient able to left both legs against gravity and hold each there without difficulty   Patient endorses decreased sensation to lateral aspect of left thigh  Psychiatric:        Behavior: Behavior is cooperative.     ED Results / Procedures / Treatments   Labs (all labs ordered are listed, but only abnormal results are displayed) Labs Reviewed - No data to display  EKG None  Radiology CT Lumbar Spine Wo Contrast  Result Date: 11/13/2020 CLINICAL DATA:  Low back pain radiating to the left hip and knee over the last 2 weeks. Cauda equina syndrome suspected. EXAM: CT LUMBAR SPINE WITHOUT CONTRAST TECHNIQUE: Multidetector CT imaging of the lumbar spine was performed  without intravenous contrast administration. Multiplanar CT image reconstructions were also generated. COMPARISON:  MRI 11/28/2011 FINDINGS: Segmentation: 5 lumbar type vertebral bodies assumed. Alignment: Normal Vertebrae: No fracture or primary bone lesion. Paraspinal and other soft tissues: Normal Disc levels: Mild degenerative changes of the discs and facets at L1-2 and above. No likely significant stenosis. L2-3: Bulging of the disc. Facet and ligamentous prominence. Mild stenosis of both lateral recesses. L3-4: Bulging of the disc. Bilateral facet degeneration. There may have been previous posterior decompression. Narrowing of the lateral recesses and neural foramina that could cause neural compression. L4-5: Disc degeneration with vacuum phenomenon. Moderate bulging of the disc. Mild facet and ligamentous hypertrophy. Mild stenosis of the lateral recesses and neural foramina that could cause neural compression. L5-S1: Mild bulging of the disc. Facet osteoarthritis left worse than right. No compressive stenosis. The facet arthropathy could be a cause of back pain or referred facet syndrome pain. Chronic fusion of the facet  joints. IMPRESSION: Degenerative disc disease and degenerative facet disease at L3-4 and L4-5, with narrowing of the lateral recesses and foramina that could possibly cause neural compression. 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 Electronically Signed   By: Paulina Fusi M.D.   On: 11/13/2020 18:52   MR LUMBAR SPINE WO CONTRAST  Result Date: 11/13/2020 CLINICAL DATA:  Low back pain. Left hip burning radiating to the knee. EXAM: MRI LUMBAR SPINE WITHOUT CONTRAST TECHNIQUE: Multiplanar, multisequence MR imaging of the lumbar spine was performed. No intravenous contrast was administered. COMPARISON:  09/28/2011 FINDINGS: Segmentation: 5 lumbar type vertebral bodies as numbered previously. Alignment:  Normal Vertebrae:  No fracture or primary bone lesion. Conus medullaris and cauda equina: Conus extends to the L1-2 level. Conus and cauda equina appear normal. Paraspinal and other soft tissues: Absent left kidney. Disc levels: T12-L1 and L1-2: Normal L2-3: Disc degeneration with shallow protrusion. Left posterolateral herniation with a fragment migrated upward into the foramen on the left, compressing the left L2 nerve. L3-4: Previous posterior decompression. Endplate osteophytes and bulging of the disc. Mild narrowing of the lateral recesses without likely neural compression. L4-5: Previous posterior decompression. Shallow protrusion of the disc mild narrowing of the lateral recesses and foramina but no likely neural compression. L5-S1: Mild bulging of the disc. Facet and ligamentous hypertrophy. Mild narrowing of the subarticular lateral recesses but without visible neural compression. IMPRESSION: At L2-3, there is shallow protrusion of the disc with a small fragment herniated and migrated upward into the foramen on the left, compressing the left L2 nerve. Interval posterior decompression at L3-4 and L4-5. Some narrowing of the  lateral recesses and foramina at those levels but no certain neural compression. L5-S1 subarticular lateral recess narrowing right more than left but without definite neural compression. Electronically Signed   By: Paulina Fusi M.D.   On: 11/13/2020 20:56   DG HIP UNILAT WITH PELVIS 2-3 VIEWS LEFT  Result Date: 11/13/2020 CLINICAL DATA:  Acute LEFT hip pain for 2 weeks. No known injury. Initial encounter. EXAM: DG HIP (WITH OR WITHOUT PELVIS) 2-3V LEFT COMPARISON:  None. FINDINGS: There is no evidence of acute fracture or subluxation. Mild joint space narrowing noted. No focal bony lesions are identified. IMPRESSION: 1. No acute abnormality. 2. Mild degenerative changes. Electronically Signed   By: Harmon Pier M.D.   On: 11/13/2020 18:29    Procedures Procedures   Medications Ordered in ED Medications  oxyCODONE-acetaminophen (PERCOCET/ROXICET) 5-325 MG  per tablet 1 tablet (1 tablet Oral Given 11/13/20 1841)    ED Course  I have reviewed the triage vital signs and the nursing notes.  Pertinent labs & imaging results that were available during my care of the patient were reviewed by me and considered in my medical decision making (see chart for details).    MDM Rules/Calculators/A&P                          Alert 65 year old male no acute distress, nontoxic-appearing.  Patient appears uncomfortable due to complaints of pain.  Patient presents with chief complaint of left lumbar back pain with radiation into his left leg.  Patient endorses numbness to left lateral thigh as well as weakness to left leg.  Physical exam patient has decreased sensation to light touch to lateral aspect of left leg.  Patient able to lift and hold left leg against gravity with increased complaints of pain.  Patient has positive left leg raise.  Tenderness to left lumbar back.  Patient also has tenderness to left hip.  No shortening or rotation noted to left leg.  Will obtain x-ray imaging of left hip to evaluate for  possible fracture. Will obtain MRI of lumbar spine to evaluate for possible cauda equina syndrome due to his decreased sensation and reports of weakness.  X-ray of left hip shows no acute abnormality, mild degenerative changes.  MRI shows: 1) at L2-L3 shallow protrusion of the disc with a small fragment herniated and migrated upward into the foramen on the left compressing the left L2 nerve. 2) intramural posterior decompression at L3-L4 and L4-L5.  Some narrowing of the lateral recesses and foraminal at those levels but no certain neural compression. 3) L5-S1 subarticular lateral recess narrowing right more than left but without definite neural compression.  Per chart review patient started on prednisone taper by primary care provider on 4/26.  Patient was also prescribed tizanidine.  Will have the patient continue this course of treatment.  We will have patient follow-up with his neurosurgeon Dr. Jordan Likes.  Discussed results, findings, treatment and follow up. Patient advised of return precautions. Patient verbalized understanding and agreed with plan.   Final Clinical Impression(s) / ED Diagnoses Final diagnoses:  Lumbar radiculopathy    Rx / DC Orders ED Discharge Orders    None       Berneice Heinrich 11/14/20 0155    Benjiman Core, MD 11/15/20 [phone]

## 2020-11-13 NOTE — Discharge Instructions (Addendum)
Came to the emergency department today to be evaluated for your back pain with radiation into your leg.  Based on your complaint of decree sensation there was concern for possible compression of your cord.  The MRI obtained showed no cord compression.  Please follow-up with your neurosurgeon Dr. Julio Sicks for further evaluation of your back pain.  Please continue take the medication prescribed by your primary care provider.  Get help right away if: You have severe pain, weakness, or numbness. You have difficulty with bladder or bowel control.

## 2020-11-13 NOTE — ED Triage Notes (Signed)
C/o L hip pain/burning that radiates down to knee x 2 weeks.  States pain started in lower back.  Denies known injury.

## 2020-11-16 DIAGNOSIS — M5416 Radiculopathy, lumbar region: Secondary | ICD-10-CM | POA: Diagnosis not present

## 2020-11-18 DIAGNOSIS — M5126 Other intervertebral disc displacement, lumbar region: Secondary | ICD-10-CM | POA: Diagnosis not present

## 2020-12-05 DIAGNOSIS — L509 Urticaria, unspecified: Secondary | ICD-10-CM | POA: Diagnosis not present

## 2020-12-05 DIAGNOSIS — R21 Rash and other nonspecific skin eruption: Secondary | ICD-10-CM | POA: Diagnosis not present

## 2020-12-14 DIAGNOSIS — R220 Localized swelling, mass and lump, head: Secondary | ICD-10-CM | POA: Diagnosis not present

## 2020-12-14 DIAGNOSIS — T7840XD Allergy, unspecified, subsequent encounter: Secondary | ICD-10-CM | POA: Diagnosis not present

## 2020-12-14 DIAGNOSIS — T783XXD Angioneurotic edema, subsequent encounter: Secondary | ICD-10-CM | POA: Diagnosis not present

## 2020-12-14 DIAGNOSIS — L509 Urticaria, unspecified: Secondary | ICD-10-CM | POA: Diagnosis not present

## 2020-12-19 DIAGNOSIS — J309 Allergic rhinitis, unspecified: Secondary | ICD-10-CM | POA: Diagnosis not present

## 2020-12-19 DIAGNOSIS — L501 Idiopathic urticaria: Secondary | ICD-10-CM | POA: Diagnosis not present

## 2020-12-19 DIAGNOSIS — J3089 Other allergic rhinitis: Secondary | ICD-10-CM | POA: Diagnosis not present

## 2020-12-19 DIAGNOSIS — J301 Allergic rhinitis due to pollen: Secondary | ICD-10-CM | POA: Diagnosis not present

## 2021-01-30 DIAGNOSIS — Z23 Encounter for immunization: Secondary | ICD-10-CM | POA: Diagnosis not present

## 2021-02-09 ENCOUNTER — Emergency Department (HOSPITAL_COMMUNITY): Payer: Medicare Other

## 2021-02-09 ENCOUNTER — Encounter (HOSPITAL_COMMUNITY): Payer: Self-pay

## 2021-02-09 ENCOUNTER — Other Ambulatory Visit: Payer: Self-pay

## 2021-02-09 ENCOUNTER — Emergency Department (HOSPITAL_COMMUNITY)
Admission: EM | Admit: 2021-02-09 | Discharge: 2021-02-09 | Disposition: A | Discharge status: Home / Self Care | Payer: Medicare Other | Attending: Emergency Medicine | Admitting: Emergency Medicine

## 2021-02-09 DIAGNOSIS — R410 Disorientation, unspecified: Secondary | ICD-10-CM | POA: Diagnosis not present

## 2021-02-09 DIAGNOSIS — R0602 Shortness of breath: Secondary | ICD-10-CM | POA: Insufficient documentation

## 2021-02-09 DIAGNOSIS — X58XXXA Exposure to other specified factors, initial encounter: Secondary | ICD-10-CM | POA: Insufficient documentation

## 2021-02-09 DIAGNOSIS — K047 Periapical abscess without sinus: Secondary | ICD-10-CM | POA: Diagnosis not present

## 2021-02-09 DIAGNOSIS — R42 Dizziness and giddiness: Secondary | ICD-10-CM | POA: Insufficient documentation

## 2021-02-09 DIAGNOSIS — R220 Localized swelling, mass and lump, head: Secondary | ICD-10-CM | POA: Diagnosis not present

## 2021-02-09 DIAGNOSIS — S0591XA Unspecified injury of right eye and orbit, initial encounter: Secondary | ICD-10-CM | POA: Diagnosis present

## 2021-02-09 DIAGNOSIS — L03211 Cellulitis of face: Secondary | ICD-10-CM | POA: Insufficient documentation

## 2021-02-09 DIAGNOSIS — S0511XA Contusion of eyeball and orbital tissues, right eye, initial encounter: Secondary | ICD-10-CM | POA: Diagnosis not present

## 2021-02-09 DIAGNOSIS — R4182 Altered mental status, unspecified: Secondary | ICD-10-CM | POA: Diagnosis not present

## 2021-02-09 LAB — CBC WITH DIFFERENTIAL/PLATELET
Abs Immature Granulocytes: 0.05 10*3/uL (ref 0.00–0.07)
Basophils Absolute: 0 10*3/uL (ref 0.0–0.1)
Basophils Relative: 0 %
Eosinophils Absolute: 0.2 10*3/uL (ref 0.0–0.5)
Eosinophils Relative: 2 %
HCT: 45.6 % (ref 39.0–52.0)
Hemoglobin: 14.7 g/dL (ref 13.0–17.0)
Immature Granulocytes: 0 %
Lymphocytes Relative: 15 %
Lymphs Abs: 1.9 10*3/uL (ref 0.7–4.0)
MCH: 25.8 pg — ABNORMAL LOW (ref 26.0–34.0)
MCHC: 32.2 g/dL (ref 30.0–36.0)
MCV: 80 fL (ref 80.0–100.0)
Monocytes Absolute: 1.2 10*3/uL — ABNORMAL HIGH (ref 0.1–1.0)
Monocytes Relative: 10 %
Neutro Abs: 9 10*3/uL — ABNORMAL HIGH (ref 1.7–7.7)
Neutrophils Relative %: 73 %
Platelets: 207 10*3/uL (ref 150–400)
RBC: 5.7 MIL/uL (ref 4.22–5.81)
RDW: 14.6 % (ref 11.5–15.5)
WBC: 12.3 10*3/uL — ABNORMAL HIGH (ref 4.0–10.5)
nRBC: 0 % (ref 0.0–0.2)

## 2021-02-09 LAB — COMPREHENSIVE METABOLIC PANEL
ALT: 18 U/L (ref 0–44)
AST: 13 U/L — ABNORMAL LOW (ref 15–41)
Albumin: 4.2 g/dL (ref 3.5–5.0)
Alkaline Phosphatase: 87 U/L (ref 38–126)
Anion gap: 9 (ref 5–15)
BUN: 13 mg/dL (ref 8–23)
CO2: 27 mmol/L (ref 22–32)
Calcium: 9.5 mg/dL (ref 8.9–10.3)
Chloride: 101 mmol/L (ref 98–111)
Creatinine, Ser: 1.38 mg/dL — ABNORMAL HIGH (ref 0.61–1.24)
GFR, Estimated: 56 mL/min — ABNORMAL LOW (ref >60)
Glucose, Bld: 95 mg/dL (ref 70–99)
Potassium: 3.8 mmol/L (ref 3.5–5.1)
Sodium: 137 mmol/L (ref 135–145)
Total Bilirubin: 1.7 mg/dL — ABNORMAL HIGH (ref 0.3–1.2)
Total Protein: 7.8 g/dL (ref 6.5–8.1)

## 2021-02-09 LAB — TROPONIN I (HIGH SENSITIVITY): Troponin I (High Sensitivity): 2 ng/L (ref <18)

## 2021-02-09 LAB — D-DIMER, QUANTITATIVE: D-Dimer, Quant: <0.27 ug/mL-FEU (ref 0.00–0.50)

## 2021-02-09 MED ORDER — CLINDAMYCIN HCL 300 MG PO CAPS
450.0000 mg | ORAL_CAPSULE | Freq: Once | ORAL | Status: AC
  Administered 2021-02-09: 450 mg via ORAL
  Filled 2021-02-09: qty 1

## 2021-02-09 MED ORDER — CLINDAMYCIN HCL 150 MG PO CAPS: 450.0000 mg | ORAL_CAPSULE | Freq: Three times a day (TID) | ORAL | 0 refills | Status: DC

## 2021-02-09 NOTE — ED Provider Notes (Signed)
[facility]-EMERGENCY DEPT Provider Note   CSN: [id number] Arrival date & time: 02/09/21  1030     History Chief Complaint  Patient presents with   Facial Swelling   Altered Mental Status   Dizziness    Kevin Howell is a 65 y.o. male.  The history is provided by the patient and medical records.  Altered Mental Status Dizziness Kevin Howell is a 65 y.o. male who presents to the Emergency Department complaining of multiple complaints. He has a history of urticaria that started in June of this year. He has been taking Pepcid and Atarax for the urticaria. If he does not take the medications the hives return. He was in his routine state of health this morning when he was driving to the allergist office. When he was driving he became short of breath, dizzy with a heavy feeling all over his body. He has associated confusion and feels like he might pass out. The symptoms are waxing and waning and overall improving. He pulled off the road and followed the signs to the hospital. He did arrive at the wrong building and was told vertigo but became confused and required someone to escort him to the emergency department. He does report that he had swelling to his right face when he woke up yesterday. It is mildly uncomfortable. He placed ice in the area, which improved it. Today he had swelling to the right face again when he woke up, which improved after ice.  No HA, CP, AP, fever, N/V/D.       History reviewed. No pertinent past medical history.  Patient Active Problem List   Diagnosis Date Noted   Lumbar stenosis with neurogenic claudication 03/04/2012    Past Surgical History:  Procedure Laterality Date   CERVICAL FUSION     4 YRS AGO   LUMBAR LAMINECTOMY/DECOMPRESSION MICRODISCECTOMY  03/04/2012   Procedure: LUMBAR LAMINECTOMY/DECOMPRESSION MICRODISCECTOMY 2 LEVELS;  Surgeon: Temple Pacini, MD;  Location: [facility];  Service: Neurosurgery;  Laterality: N/A;   Lumbar Laminotomies/Decompression Lumbar Three-Four, Lumbar Four-Five    NEPHRECTOMY     GIVEN T SISTER    TONSILLECTOMY     AGE 24        History reviewed. No pertinent family history.  Social History   Tobacco Use   Smoking status: Never   Smokeless tobacco: Never  Vaping Use   Vaping Use: Never used  Substance Use Topics   Alcohol use: No   Drug use: No    Home Medications Prior to Admission medications   Medication Sig Start Date End Date Taking? Authorizing Provider  clindamycin (CLEOCIN) 150 MG capsule Take 3 capsules (450 mg total) by mouth 3 (three) times daily. 02/09/21  Yes Tilden Fossa, MD  aspirin EC 325 MG tablet Take 325 mg by mouth daily as needed. For pain    [provider]    Allergies    Advil [ibuprofen]  Review of Systems   Review of Systems  Neurological:  Positive for dizziness.  All other systems reviewed and are negative.  Physical Exam Updated Vital Signs BP (!) 139/100   Pulse 86   Temp 98 F (36.7 C)   Resp 20   Ht 5\' 6"  (1.676 m)   Wt 78 kg   SpO2 97%   BMI 27.76 kg/m   Physical Exam Vitals and nursing note reviewed.  Constitutional:      Appearance: He is well-developed.  HENT:     Head:  Normocephalic and atraumatic.     Comments: There is right infraorbital ecchymosis.  There is firm edema to the right midface. No significant intraoral erythema or edema. Pupils equal round and reactive, EOM I. Cardiovascular:     Rate and Rhythm: Normal rate and regular rhythm.     Heart sounds: No murmur heard. Pulmonary:     Effort: Pulmonary effort is normal. No respiratory distress.     Breath sounds: Normal breath sounds.  Abdominal:     Palpations: Abdomen is soft.     Tenderness: There is no abdominal tenderness. There is no guarding or rebound.  Musculoskeletal:        General: No swelling or tenderness.  Skin:    General: Skin is warm and dry.  Neurological:     Mental Status: He is alert and oriented to person,  place, and time.     Comments: No asymmetry of facial movements. Five out of five strength in all four extremities with sensation to light touch intact in all four extremities  Psychiatric:        Behavior: Behavior normal.    ED Results / Procedures / Treatments   Labs (all labs ordered are listed, but only abnormal results are displayed) Labs Reviewed  COMPREHENSIVE METABOLIC PANEL - Abnormal; Notable for the following components:      Result Value   Creatinine, Ser 1.38 (*)    AST 13 (*)    Total Bilirubin 1.7 (*)    GFR, Estimated 56 (*)    All other components within normal limits  CBC WITH DIFFERENTIAL/PLATELET - Abnormal; Notable for the following components:   WBC 12.3 (*)    MCH 25.8 (*)    Neutro Abs 9.0 (*)    Monocytes Absolute 1.2 (*)    All other components within normal limits  D-DIMER, QUANTITATIVE  TROPONIN I (HIGH SENSITIVITY)    EKG EKG Interpretation  Date/Time:  Thursday February 09 2021 11:45:20 EDT Ventricular Rate:  85 PR Interval:  174 QRS Duration: 84 QT Interval:  362 QTC Calculation: 430 R Axis:   61 Text Interpretation: Normal sinus rhythm Normal ECG Confirmed by Tilden Fossa [phone]) on 02/09/2021 12:30:53 PM  Radiology CT Head Wo Contrast  Result Date: 02/09/2021 CLINICAL DATA:  Mental status change, unknown cause. Swelling. Additional provided: Facial swelling on right side (right eye and upper lip). Intermittent confusion. EXAM: CT HEAD WITHOUT CONTRAST CT MAXILLOFACIAL WITHOUT CONTRAST TECHNIQUE: Multidetector CT imaging of the head and maxillofacial structures were performed using the standard protocol without intravenous contrast. Multiplanar CT image reconstructions of the maxillofacial structures were also generated. COMPARISON:  None. FINDINGS: CT HEAD FINDINGS Brain: Cerebral volume is normal for age. Mild patchy and ill-defined hypoattenuation within the cerebral white matter, nonspecific but compatible with chronic small vessel ischemic  disease. There is no acute intracranial hemorrhage. No demarcated cortical infarct. No extra-axial fluid collection. No evidence of an intracranial mass. No midline shift. Vascular: No hyperdense vessel.  Atherosclerotic calcifications Skull: Normal. Negative for fracture or focal lesion. Other: No significant mastoid effusion. CT MAXILLOFACIAL FINDINGS Osseous: No maxillofacial fracture is identified. Periapical lucency surrounds the right maxillary lateral incisor (series 4, image 52). Periapical lucency also surrounds the right first molar (series 4, image 49). Orbits: No acute orbital finding. The globes are normal in size and contour. The extraocular muscles and optic nerve sheath complexes are symmetric and unremarkable. Sinuses: Trace mucosal thickening and small mucous retention cysts within the maxillary sinuses bilaterally. Trace mucosal thickening within  the right frontal sinus and bilateral ethmoid air cells. Soft tissues: Swelling of the right maxillofacial and perimandibular soft tissues with associated fat stranding. Additionally, there is a region of induration measuring 2.4 x 2.3 cm in transaxial dimensions along the paramedian anterior right maxillary alveolus and frontal process of right maxilla, which may reflect phlegmon. No evidence of abscess on this non-contrast examination. IMPRESSION: CT head: 1. No evidence of acute intracranial abnormality. 2. Mild chronic small vessel ischemic changes within the cerebral white matter. CT maxillofacial: 1. Swelling of the right maxillofacial and perimandibular soft tissues with associated fat stranding. Findings may reflect cellulitis in the appropriate clinical setting. Consider an odontogenic etiology (periapical lucency surrounds the right maxillary lateral incisor and right maxillary first molar). 2. 2.4 x 2.3 cm region of soft tissue induration along the paramedian anterior right maxillary alveolus and frontal process of right maxilla, which may  reflect phlegmon. 3. Mild paranasal sinus disease, as described. Electronically Signed   By: Jackey Loge DO   On: 02/09/2021 12:59   DG Chest Port 1 View  Result Date: 02/09/2021 CLINICAL DATA:  SOB EXAM: PORTABLE CHEST 1 VIEW COMPARISON:  None FINDINGS: The heart size and mediastinal contours are within normal limits. Both lungs are clear. The visualized skeletal structures are unremarkable. Status post ACDF. IMPRESSION: No active disease. Electronically Signed   By: Signa Kell M.D.   On: 02/09/2021 12:43   CT Maxillofacial WO CM  Result Date: 02/09/2021 CLINICAL DATA:  Mental status change, unknown cause. Swelling. Additional provided: Facial swelling on right side (right eye and upper lip). Intermittent confusion. EXAM: CT HEAD WITHOUT CONTRAST CT MAXILLOFACIAL WITHOUT CONTRAST TECHNIQUE: Multidetector CT imaging of the head and maxillofacial structures were performed using the standard protocol without intravenous contrast. Multiplanar CT image reconstructions of the maxillofacial structures were also generated. COMPARISON:  None. FINDINGS: CT HEAD FINDINGS Brain: Cerebral volume is normal for age. Mild patchy and ill-defined hypoattenuation within the cerebral white matter, nonspecific but compatible with chronic small vessel ischemic disease. There is no acute intracranial hemorrhage. No demarcated cortical infarct. No extra-axial fluid collection. No evidence of an intracranial mass. No midline shift. Vascular: No hyperdense vessel.  Atherosclerotic calcifications Skull: Normal. Negative for fracture or focal lesion. Other: No significant mastoid effusion. CT MAXILLOFACIAL FINDINGS Osseous: No maxillofacial fracture is identified. Periapical lucency surrounds the right maxillary lateral incisor (series 4, image 52). Periapical lucency also surrounds the right first molar (series 4, image 49). Orbits: No acute orbital finding. The globes are normal in size and contour. The extraocular muscles and  optic nerve sheath complexes are symmetric and unremarkable. Sinuses: Trace mucosal thickening and small mucous retention cysts within the maxillary sinuses bilaterally. Trace mucosal thickening within the right frontal sinus and bilateral ethmoid air cells. Soft tissues: Swelling of the right maxillofacial and perimandibular soft tissues with associated fat stranding. Additionally, there is a region of induration measuring 2.4 x 2.3 cm in transaxial dimensions along the paramedian anterior right maxillary alveolus and frontal process of right maxilla, which may reflect phlegmon. No evidence of abscess on this non-contrast examination. IMPRESSION: CT head: 1. No evidence of acute intracranial abnormality. 2. Mild chronic small vessel ischemic changes within the cerebral white matter. CT maxillofacial: 1. Swelling of the right maxillofacial and perimandibular soft tissues with associated fat stranding. Findings may reflect cellulitis in the appropriate clinical setting. Consider an odontogenic etiology (periapical lucency surrounds the right maxillary lateral incisor and right maxillary first molar). 2. 2.4 x 2.3 cm  region of soft tissue induration along the paramedian anterior right maxillary alveolus and frontal process of right maxilla, which may reflect phlegmon. 3. Mild paranasal sinus disease, as described. Electronically Signed   By: Jackey LogeKyle  Golden DO   On: 02/09/2021 12:59    Procedures Procedures   Medications Ordered in ED Medications  clindamycin (CLEOCIN) capsule 450 mg (450 mg Oral Given 02/09/21 1432)    ED Course  I have reviewed the triage vital signs and the nursing notes.  Pertinent labs & imaging results that were available during my care of the patient were reviewed by me and considered in my medical decision making (see chart for details).    MDM Rules/Calculators/A&P                          patient here for evaluation of shortness of breath, confusion and facial stilling. He is  non-toxic appearing on evaluation with no focal neurologic deficits. He does have some swelling to the right mid face without discrete noticeable abscess. CT scan is concerning for cellulitis and possible early. Apical abscess. CBC with mild leukocytosis. No significant anemia or electrolyte abnormality. Doubt PE, D dimer is negative. Presentation is not consistent with ACS. No evidence of anaphylaxis or systemic allergic reaction on evaluation. Will start antibiotics for facial cellulitis with early dental abscess. In terms of his episode of confusion, question if patient had a stress response versus panic attack due to this facial swelling. Source of the episode is unclear at this time. Presentation is not consistent with TIA/CVA. Plan to discharge home with outpatient follow-up and return precautions.  Final Clinical Impression(s) / ED Diagnoses Final diagnoses:  Dental abscess  Shortness of breath  Facial cellulitis    Rx / DC Orders ED Discharge Orders          Ordered    clindamycin (CLEOCIN) 150 MG capsule  3 times daily        02/09/21 1358             Tilden Fossaees, Laresa Oshiro, MD 02/09/21 [phone]

## 2021-02-09 NOTE — ED Triage Notes (Signed)
Patient states he went to an allergy doctor in June for hives. Patient was given  prescriptions for the same.  Patient states he had facial swelling on the right side of his face including his right eye and upper lip. Patient states the same this AM.  Patient was on his way to see the allergy doctor and states he felt confused and came to the ED. Patient reports that he had been having intermittent confusion while driving.

## 2021-04-03 DIAGNOSIS — I10 Essential (primary) hypertension: Secondary | ICD-10-CM | POA: Diagnosis not present

## 2021-04-03 DIAGNOSIS — N182 Chronic kidney disease, stage 2 (mild): Secondary | ICD-10-CM | POA: Diagnosis not present

## 2021-04-03 DIAGNOSIS — E0590 Thyrotoxicosis, unspecified without thyrotoxic crisis or storm: Secondary | ICD-10-CM | POA: Diagnosis not present

## 2021-04-10 DIAGNOSIS — N1831 Chronic kidney disease, stage 3a: Secondary | ICD-10-CM | POA: Diagnosis not present

## 2021-04-10 DIAGNOSIS — Z23 Encounter for immunization: Secondary | ICD-10-CM | POA: Diagnosis not present

## 2021-04-10 DIAGNOSIS — Z0000 Encounter for general adult medical examination without abnormal findings: Secondary | ICD-10-CM | POA: Diagnosis not present

## 2021-04-10 DIAGNOSIS — E0590 Thyrotoxicosis, unspecified without thyrotoxic crisis or storm: Secondary | ICD-10-CM | POA: Diagnosis not present

## 2021-04-10 DIAGNOSIS — I10 Essential (primary) hypertension: Secondary | ICD-10-CM | POA: Diagnosis not present

## 2021-04-13 DIAGNOSIS — E0590 Thyrotoxicosis, unspecified without thyrotoxic crisis or storm: Secondary | ICD-10-CM | POA: Diagnosis not present

## 2021-04-13 DIAGNOSIS — Z940 Kidney transplant status: Secondary | ICD-10-CM | POA: Diagnosis not present

## 2021-04-25 DIAGNOSIS — J3089 Other allergic rhinitis: Secondary | ICD-10-CM | POA: Diagnosis not present

## 2021-04-25 DIAGNOSIS — L501 Idiopathic urticaria: Secondary | ICD-10-CM | POA: Diagnosis not present

## 2021-04-25 DIAGNOSIS — J301 Allergic rhinitis due to pollen: Secondary | ICD-10-CM | POA: Diagnosis not present

## 2021-05-09 DIAGNOSIS — E0590 Thyrotoxicosis, unspecified without thyrotoxic crisis or storm: Secondary | ICD-10-CM | POA: Diagnosis not present

## 2021-05-09 DIAGNOSIS — Z940 Kidney transplant status: Secondary | ICD-10-CM | POA: Diagnosis not present

## 2021-07-03 DIAGNOSIS — E0590 Thyrotoxicosis, unspecified without thyrotoxic crisis or storm: Secondary | ICD-10-CM | POA: Diagnosis not present

## 2021-07-03 DIAGNOSIS — Z940 Kidney transplant status: Secondary | ICD-10-CM | POA: Diagnosis not present

## 2021-09-06 DIAGNOSIS — E0590 Thyrotoxicosis, unspecified without thyrotoxic crisis or storm: Secondary | ICD-10-CM | POA: Diagnosis not present

## 2021-09-17 ENCOUNTER — Other Ambulatory Visit: Payer: Self-pay

## 2021-09-17 ENCOUNTER — Encounter (HOSPITAL_COMMUNITY): Payer: Self-pay

## 2021-09-17 ENCOUNTER — Ambulatory Visit (HOSPITAL_COMMUNITY)
Admission: EM | Admit: 2021-09-17 | Discharge: 2021-09-17 | Disposition: A | Discharge status: Home / Self Care | Payer: Medicare Other | Attending: Emergency Medicine | Admitting: Emergency Medicine

## 2021-09-17 DIAGNOSIS — H00011 Hordeolum externum right upper eyelid: Secondary | ICD-10-CM | POA: Diagnosis not present

## 2021-09-17 DIAGNOSIS — H1013 Acute atopic conjunctivitis, bilateral: Secondary | ICD-10-CM | POA: Diagnosis not present

## 2021-09-17 MED ORDER — ERYTHROMYCIN 5 MG/GM OP OINT: TOPICAL_OINTMENT | OPHTHALMIC | 0 refills | Status: DC

## 2021-09-17 MED ORDER — OLOPATADINE HCL 0.2 % OP SOLN: 1.0000 [drp] | Freq: Every day | OPHTHALMIC | 0 refills | Status: AC

## 2021-09-17 MED ORDER — CETIRIZINE HCL 10 MG PO TABS: 10.0000 mg | ORAL_TABLET | Freq: Every day | ORAL | 0 refills | Status: DC

## 2021-09-17 NOTE — Discharge Instructions (Addendum)
?  Please use the medications as prescribed. Follow up with primary care in 4-5 days if not improving. Follow up sooner if you develop worsening pain, swelling, or change in vision.  ?

## 2021-09-17 NOTE — ED Provider Notes (Signed)
?[facility] ? ? ? ?CSN: [id number] ?Arrival date & time: 09/17/21  1212 ? ? ?  ? ?History   ?Chief Complaint ?Chief Complaint  ?Patient presents with  ? Conjunctivitis  ? ? ?HPI ?Kevin Howell is a 66 y.o. male.  ? ?HPI ?Kevin Howell is a 66 y.o. male presenting to UC with c/o bilateral eye redness and irritation that started 2 days ago.  Symptoms nearly resolved in his Left eye after using OTC visine but symptoms of redness and pain worsened in Right eye with swelling to his upper eyelid.  Pain is 3/10.  Denies injury to the eye. Does not wear contacts.  ? ? ?History reviewed. No pertinent past medical history. ? ?Patient Active Problem List  ? Diagnosis Date Noted  ? Lumbar stenosis with neurogenic claudication 03/04/2012  ? ? ?Past Surgical History:  ?Procedure Laterality Date  ? CERVICAL FUSION    ? 4 YRS AGO  ? LUMBAR LAMINECTOMY/DECOMPRESSION MICRODISCECTOMY  03/04/2012  ? Procedure: LUMBAR LAMINECTOMY/DECOMPRESSION MICRODISCECTOMY 2 LEVELS;  Surgeon: Temple Pacini, MD;  Location: [facility];  Service: Neurosurgery;  Laterality: N/A;  Lumbar Laminotomies/Decompression Lumbar Three-Four, Lumbar Four-Five   ? NEPHRECTOMY    ? GIVEN T SISTER   ? TONSILLECTOMY    ? AGE 36   ? ? ? ? ? ?Home Medications   ? ?Prior to Admission medications   ?Medication Sig Start Date End Date Taking? Authorizing Provider  ?cetirizine (ZYRTEC) 10 MG tablet Take 1 tablet (10 mg total) by mouth daily. 09/17/21  Yes Lurene Shadow, PA-C  ?erythromycin ophthalmic ointment Place a 1/2 inch ribbon of ointment into the lower Right eyelid 3 times daily for 5 days. 09/17/21  Yes Lurene Shadow, PA-C  ?Olopatadine HCl 0.2 % SOLN Apply 1 drop to eye daily. 09/17/21  Yes Lurene Shadow, PA-C  ?aspirin EC 325 MG tablet Take 325 mg by mouth daily as needed. For pain    [provider]  ?clindamycin (CLEOCIN) 150 MG capsule Take 3 capsules (450 mg total) by mouth 3 (three) times daily. 02/09/21   Tilden Fossa, MD  ? ? ?Family  History ?History reviewed. No pertinent family history. ? ?Social History ?Social History  ? ?Tobacco Use  ? Smoking status: Never  ? Smokeless tobacco: Never  ?Vaping Use  ? Vaping Use: Never used  ?Substance Use Topics  ? Alcohol use: No  ? Drug use: No  ? ? ? ?Allergies   ?Advil [ibuprofen] ? ? ?Review of Systems ?Review of Systems  ?Constitutional:  Negative for chills and fever.  ?HENT:  Negative for ear pain and sore throat.   ?Eyes:  Positive for pain, discharge, redness and itching. Negative for photophobia and visual disturbance.  ?Neurological:  Negative for dizziness and headaches.  ? ? ?Physical Exam ?Triage Vital Signs ?ED Triage Vitals  ?Enc Vitals Group  ?   BP 09/17/21 1242 113/73  ?   Pulse Rate 09/17/21 1242 86  ?   Resp 09/17/21 1242 18  ?   Temp 09/17/21 1242 98 ?F (36.7 ?C)  ?   Temp Source 09/17/21 1242 Oral  ?   SpO2 09/17/21 1242 99 %  ?   Weight --   ?   Height --   ?   Head Circumference --   ?   Peak Flow --   ?   Pain Score 09/17/21 1240 3  ?   Pain Loc --   ?  Pain Edu? --   ?   Excl. in GC? --   ? ?No data found. ? ?Updated Vital Signs ?BP 113/73 (BP Location: Right Arm)   Pulse 86   Temp 98 ?F (36.7 ?C) (Oral)   Resp 18   SpO2 99%  ? ?Visual Acuity ?Right Eye Distance:   ?Left Eye Distance:   ?Bilateral Distance:   ? ?Right Eye Near:   ?Left Eye Near:    ?Bilateral Near:    ? ?Physical Exam ?Vitals and nursing note reviewed.  ?Constitutional:   ?   Appearance: Normal appearance. He is well-developed.  ?HENT:  ?   Head: Normocephalic and atraumatic.  ?   Right Ear: Tympanic membrane and ear canal normal.  ?   Left Ear: Tympanic membrane and ear canal normal.  ?   Nose: Nose normal.  ?   Right Sinus: No maxillary sinus tenderness or frontal sinus tenderness.  ?   Left Sinus: No maxillary sinus tenderness or frontal sinus tenderness.  ?   Mouth/Throat:  ?   Lips: Pink.  ?   Mouth: Mucous membranes are moist.  ?   Pharynx: Oropharynx is clear. Uvula midline.  ?Eyes:  ?   General:  Vision grossly intact. Gaze aligned appropriately.     ?   Right eye: Hordeolum present. No discharge.     ?   Left eye: No discharge or hordeolum.  ?   Extraocular Movements: Extraocular movements intact.  ?   Conjunctiva/sclera:  ?   Right eye: Right conjunctiva is injected.  ?   Left eye: Left conjunctiva is injected.  ?   Pupils: Pupils are equal, round, and reactive to light.  ? ?Cardiovascular:  ?   Rate and Rhythm: Normal rate and regular rhythm.  ?Pulmonary:  ?   Effort: Pulmonary effort is normal. No respiratory distress.  ?   Breath sounds: Normal breath sounds.  ?Musculoskeletal:     ?   General: Normal range of motion.  ?   Cervical back: Normal range of motion and neck supple.  ?Lymphadenopathy:  ?   Cervical: No cervical adenopathy.  ?Skin: ?   General: Skin is warm and dry.  ?Neurological:  ?   Mental Status: He is alert and oriented to person, place, and time.  ?Psychiatric:     ?   Behavior: Behavior normal.  ? ? ? ?UC Treatments / Results  ?Labs ?(all labs ordered are listed, but only abnormal results are displayed) ?Labs Reviewed - No data to display ? ?EKG ? ? ?Radiology ?No results found. ? ?Procedures ?Procedures (including critical care time) ? ?Medications Ordered in UC ?Medications - No data to display ? ?Initial Impression / Assessment and Plan / UC Course  ?I have reviewed the triage vital signs and the nursing notes. ? ?Pertinent labs & imaging results that were available during my care of the patient were reviewed by me and considered in my medical decision making (see chart for details). ? ?  ? ?Hx and exam c/w bilateral allergic conjunctivitis with secondary hordeolum of Right upper eyelid. ?Rx: pataday for both eyes, erythromycin ointment for Right eye ?Home care discussed with pt, AVS provided. ? ?Final Clinical Impressions(s) / UC Diagnoses  ? ?Final diagnoses:  ?Allergic conjunctivitis of both eyes  ?Hordeolum externum of right upper eyelid  ? ? ? ?Discharge Instructions   ? ?   ? ?Please use the medications as prescribed. Follow up with primary care in 4-5 days if not  improving. Follow up sooner if you develop worsening pain, swelling, or change in vision.  ? ? ? ? ?ED Prescriptions   ? ? Medication Sig Dispense Auth. Provider  ? Olopatadine HCl 0.2 % SOLN Apply 1 drop to eye daily. 2.5 mL Lurene Shadow, PA-C  ? erythromycin ophthalmic ointment Place a 1/2 inch ribbon of ointment into the lower Right eyelid 3 times daily for 5 days. 3.5 g Lurene Shadow, PA-C  ? cetirizine (ZYRTEC) 10 MG tablet Take 1 tablet (10 mg total) by mouth daily. 30 tablet Lurene Shadow, [place]  ? ?  ? ?PDMP not reviewed this encounter. ?  ?Lurene Shadow, PA-C ?09/17/21 1334 ? ?

## 2021-09-17 NOTE — ED Triage Notes (Signed)
Pt reports having redness and soreness to both eyes. ?

## 2021-10-16 DIAGNOSIS — E0590 Thyrotoxicosis, unspecified without thyrotoxic crisis or storm: Secondary | ICD-10-CM | POA: Diagnosis not present

## 2021-10-16 DIAGNOSIS — I10 Essential (primary) hypertension: Secondary | ICD-10-CM | POA: Diagnosis not present

## 2021-10-16 DIAGNOSIS — N1831 Chronic kidney disease, stage 3a: Secondary | ICD-10-CM | POA: Diagnosis not present

## 2021-10-23 DIAGNOSIS — N1831 Chronic kidney disease, stage 3a: Secondary | ICD-10-CM | POA: Diagnosis not present

## 2021-10-23 DIAGNOSIS — I10 Essential (primary) hypertension: Secondary | ICD-10-CM | POA: Diagnosis not present

## 2021-10-23 DIAGNOSIS — E0590 Thyrotoxicosis, unspecified without thyrotoxic crisis or storm: Secondary | ICD-10-CM | POA: Diagnosis not present

## 2021-10-23 DIAGNOSIS — Z940 Kidney transplant status: Secondary | ICD-10-CM | POA: Diagnosis not present

## 2021-11-04 ENCOUNTER — Emergency Department (HOSPITAL_COMMUNITY)
Admission: EM | Admit: 2021-11-04 | Discharge: 2021-11-04 | Disposition: A | Discharge status: Home / Self Care | Payer: Medicare Other | Attending: Emergency Medicine | Admitting: Emergency Medicine

## 2021-11-04 ENCOUNTER — Emergency Department (HOSPITAL_COMMUNITY): Payer: Medicare Other

## 2021-11-04 ENCOUNTER — Encounter (HOSPITAL_COMMUNITY): Payer: Self-pay

## 2021-11-04 DIAGNOSIS — R059 Cough, unspecified: Secondary | ICD-10-CM | POA: Diagnosis not present

## 2021-11-04 DIAGNOSIS — R509 Fever, unspecified: Secondary | ICD-10-CM | POA: Diagnosis present

## 2021-11-04 DIAGNOSIS — U071 COVID-19: Secondary | ICD-10-CM | POA: Diagnosis not present

## 2021-11-04 DIAGNOSIS — Z7982 Long term (current) use of aspirin: Secondary | ICD-10-CM | POA: Insufficient documentation

## 2021-11-04 LAB — RESP PANEL BY RT-PCR (FLU A&B, COVID) ARPGX2
Influenza A by PCR: NEGATIVE
Influenza B by PCR: NEGATIVE
SARS Coronavirus 2 by RT PCR: POSITIVE — AB

## 2021-11-04 MED ORDER — NIRMATRELVIR/RITONAVIR (PAXLOVID)TABLET: 3.0000 | ORAL_TABLET | Freq: Two times a day (BID) | ORAL | 0 refills | Status: AC

## 2021-11-04 MED ORDER — ACETAMINOPHEN 325 MG PO TABS
650.0000 mg | ORAL_TABLET | Freq: Once | ORAL | Status: AC
  Administered 2021-11-04: 650 mg via ORAL
  Filled 2021-11-04 (×2): qty 2

## 2021-11-04 NOTE — Discharge Instructions (Addendum)
It was a pleasure taking care of you!  ? ? ?Your COVID and flu swab was positive for COVID.  According to the CDC, you must self quarantine for 5 days from the start of your symptoms.  Your quarantine period ends on  11/07/21.  You will be sent a prescription for Paxlovid, take as prescribed.  You may continue taking over-the-counter DayQuil as needed for your symptoms. Ensure to maintain fluid intake with tea, soup, broth, Pedialyte, Gatorade, water. You may follow-up with your primary care provider as needed.  Return to the Emergency Department if you are experiencing trouble breathing, worsening or increasing chest pain, decreased fluid intake or worsening symptoms. ?

## 2021-11-04 NOTE — ED Notes (Signed)
Patient transported to X-ray 

## 2021-11-04 NOTE — ED Provider Notes (Signed)
?[name] ?Provider Note ? ? ?CSN: [id number] ?Arrival date & time: 11/04/21  1415 ? ?  ? ?History ? ?Chief Complaint  ?Patient presents with  ? Fever  ? Chills  ? ? ?Ronie SpiesBilly D Howell is a 66 y.o. male who presents to the emergency department complaining of dry cough, fever, chills onset last night.  Denies sick contacts.  Patient also notes resolved nausea.  Has tried over-the-counter DayQuil with no relief of his symptoms.  Denies rhinorrhea, nasal congestion, chest pain, shortness of breath, abdominal pain, vomiting. ? ?The history is provided by the patient. No language interpreter was used.  ? ?  ? ?Home Medications ?Prior to Admission medications   ?Medication Sig Start Date End Date Taking? Authorizing Provider  ?nirmatrelvir/ritonavir EUA (PAXLOVID) 20 x 150 MG & 10 x 100MG  TABS Take 3 tablets by mouth 2 (two) times daily for 5 days. Patient GFR is 56. Take nirmatrelvir (150 mg) two tablets twice daily for 5 days and ritonavir (100 mg) one tablet twice daily for 5 days. 11/04/21 11/09/21 Yes Kaiea Esselman A, PA-C  ?aspirin EC 325 MG tablet Take 325 mg by mouth daily as needed. For pain    [provider]  ?cetirizine (ZYRTEC) 10 MG tablet Take 1 tablet (10 mg total) by mouth daily. 09/17/21   Lurene ShadowPhelps, Erin O, PA-C  ?clindamycin (CLEOCIN) 150 MG capsule Take 3 capsules (450 mg total) by mouth 3 (three) times daily. 02/09/21   Tilden Fossaees, Elizabeth, MD  ?erythromycin ophthalmic ointment Place a 1/2 inch ribbon of ointment into the lower Right eyelid 3 times daily for 5 days. 09/17/21   Lurene ShadowPhelps, Erin O, PA-C  ?Olopatadine HCl 0.2 % SOLN Apply 1 drop to eye daily. 09/17/21   Lurene ShadowPhelps, Erin O, PA-C  ?   ? ?Allergies    ?Advil [ibuprofen]   ? ?Review of Systems   ?Review of Systems  ?Constitutional:  Positive for fever. Negative for chills.  ?HENT:  Negative for congestion and rhinorrhea.   ?Respiratory:  Positive for cough. Negative for shortness of breath.   ?Cardiovascular:  Negative  for chest pain.  ?Gastrointestinal:  Positive for nausea. Negative for abdominal pain and vomiting.  ?All other systems reviewed and are negative. ? ?Physical Exam ?Updated Vital Signs ?BP 124/83 (BP Location: Left Arm)   Pulse 92   Temp (!) 100.6 ?F (38.1 ?C) (Oral)   Resp 17   SpO2 97%  ?Physical Exam ?Vitals and nursing note reviewed.  ?Constitutional:   ?   General: He is not in acute distress. ?   Appearance: He is not diaphoretic.  ?HENT:  ?   Head: Normocephalic and atraumatic.  ?   Mouth/Throat:  ?   Pharynx: No oropharyngeal exudate.  ?Eyes:  ?   General: No scleral icterus. ?   Conjunctiva/sclera: Conjunctivae normal.  ?Cardiovascular:  ?   Rate and Rhythm: Normal rate and regular rhythm.  ?   Pulses: Normal pulses.  ?   Heart sounds: Normal heart sounds.  ?Pulmonary:  ?   Effort: Pulmonary effort is normal. No respiratory distress.  ?   Breath sounds: Normal breath sounds. No wheezing.  ?Abdominal:  ?   General: Bowel sounds are normal.  ?   Palpations: Abdomen is soft. There is no mass.  ?   Tenderness: There is no abdominal tenderness. There is no guarding or rebound.  ?Musculoskeletal:     ?   General: Normal range of motion.  ?   Cervical  back: Normal range of motion and neck supple.  ?Skin: ?   General: Skin is warm and dry.  ?Neurological:  ?   Mental Status: He is alert.  ?Psychiatric:     ?   Behavior: Behavior normal.  ? ? ?ED Results / Procedures / Treatments   ?Labs ?(all labs ordered are listed, but only abnormal results are displayed) ?Labs Reviewed  ?RESP PANEL BY RT-PCR (FLU A&B, COVID) ARPGX2 - Abnormal; Notable for the following components:  ?    Result Value  ? SARS Coronavirus 2 by RT PCR POSITIVE (*)   ? All other components within normal limits  ? ? ?EKG ?None ? ?Radiology ?DG Chest 2 View ? ?Result Date: 11/04/2021 ?CLINICAL DATA:  Dry cough. EXAM: CHEST - 2 VIEW COMPARISON:  February 09, 2021 FINDINGS: Cardiomediastinal silhouette is normal. Mediastinal contours appear intact. There  is no evidence of focal airspace consolidation, pleural effusion or pneumothorax. Osseous structures are without acute abnormality. Soft tissues are grossly normal. IMPRESSION: No active cardiopulmonary disease. Electronically Signed   By: Ted Mcalpine M.D.   On: 11/04/2021 15:07   ? ?Procedures ?Procedures  ? ? ?Medications Ordered in ED ?Medications  ?acetaminophen (TYLENOL) tablet 650 mg (650 mg Oral Given 11/04/21 1539)  ? ? ?ED Course/ Medical Decision Making/ A&P ?Clinical Course as of 11/04/21 1654  ?Sat Nov 04, 2021  ?1613 SARS Coronavirus 2 by RT PCR(!): POSITIVE [SB]  ?1630 Discussion with pharmacist regarding med reconciliation and determination of which medication would work best for patient's positive COVID. [SB]  ?[id number] Discussed with patient and family member at bedside regarding positive COVID test.  Discussed discharge treatment plan.  Answered all available questions.  Patient for safe for discharge at this time. [SB]  ?  ?Clinical Course User Index ?[SB] Armani Gawlik A, PA-C  ? ?                        ?Medical Decision Making ?Amount and/or Complexity of Data Reviewed ?Labs:  Decision-making details documented in ED Course. ?Radiology: ordered. ? ?Risk ?OTC drugs. ? ? ?Dry cough, fever, chills onset last night.  Denies sick contacts.  Has tried over-the-counter DayQuil with no relief in symptoms.  Initial vital signs, patient febrile at 100.6.  On exam patient without acute cardiovascular, respiratory, abdominal exam findings.  Differential diagnosis includes COVID, flu, pneumonia, viral URI with cough. ? ?Labs:  ?I ordered, and personally interpreted labs.  The pertinent results include:   ?Flu swab negative. ?COVID swab positive ? ?Imaging: ?I ordered imaging studies including chest x-ray ?I independently visualized and interpreted imaging which showed: No acute cardiopulmonary findings ?I agree with the radiologist interpretation ? ?Medications:  ?I ordered medication including Tylenol  for symptom management ?Reevaluation of the patient after these medicines and interventions, I reevaluated the patient and found that they have improved ?Fever slightly improved to 100.3 at time of discharge. ?I have reviewed the patients home medicines and have made adjustments as needed ? ? ?Disposition: ?Patient presentation suspicious for COVID-19.  Doubt flu, viral URI with cough, pneumonia at this time. After consideration of the diagnostic results and the patients response to treatment, I feel that the patient would benefit from Discharge home.  Patient will be discharged home with prescription for Paxlovid.  Thorough discussion with patient regarding isolation/quarantine..  Work note provided.  Supportive care measures and strict return precautions discussed with patient at bedside. Pt acknowledges and verbalizes understanding. Pt appears safe  for discharge. Follow up as indicated in discharge paperwork.  ? ? ?This chart was dictated using voice recognition software, Dragon. Despite the best efforts of this provider to proofread and correct errors, errors may still occur which can change documentation meaning. ? ? ?Final Clinical Impression(s) / ED Diagnoses ?Final diagnoses:  ?COVID-19  ? ? ?Rx / DC Orders ?ED Discharge Orders   ? ?      Ordered  ?  nirmatrelvir/ritonavir EUA (PAXLOVID) 20 x 150 MG & 10 x 100MG  TABS  2 times daily       ? 11/04/21 1640  ? ?  ?  ? ?  ? ? ?  ?Gae Bihl A, PA-C ?11/04/21 1654 ? ?  ?11/06/21, MD ?11/04/21 2317 ? ?

## 2021-11-04 NOTE — ED Triage Notes (Signed)
Pt presents with c/o cough, fever, and chills that started last night. ?

## 2021-11-07 DIAGNOSIS — H01002 Unspecified blepharitis right lower eyelid: Secondary | ICD-10-CM | POA: Diagnosis not present

## 2021-11-15 DIAGNOSIS — H00011 Hordeolum externum right upper eyelid: Secondary | ICD-10-CM | POA: Diagnosis not present

## 2021-11-15 DIAGNOSIS — H0102B Squamous blepharitis left eye, upper and lower eyelids: Secondary | ICD-10-CM | POA: Diagnosis not present

## 2021-11-15 DIAGNOSIS — H00015 Hordeolum externum left lower eyelid: Secondary | ICD-10-CM | POA: Diagnosis not present

## 2021-11-15 DIAGNOSIS — H00012 Hordeolum externum right lower eyelid: Secondary | ICD-10-CM | POA: Diagnosis not present

## 2021-11-15 DIAGNOSIS — H2513 Age-related nuclear cataract, bilateral: Secondary | ICD-10-CM | POA: Diagnosis not present

## 2021-11-15 DIAGNOSIS — H0102A Squamous blepharitis right eye, upper and lower eyelids: Secondary | ICD-10-CM | POA: Diagnosis not present

## 2021-11-16 DIAGNOSIS — H00011 Hordeolum externum right upper eyelid: Secondary | ICD-10-CM | POA: Diagnosis not present

## 2021-11-16 DIAGNOSIS — H00012 Hordeolum externum right lower eyelid: Secondary | ICD-10-CM | POA: Diagnosis not present

## 2021-11-23 DIAGNOSIS — H00015 Hordeolum externum left lower eyelid: Secondary | ICD-10-CM | POA: Diagnosis not present

## 2022-01-12 ENCOUNTER — Other Ambulatory Visit: Payer: Self-pay

## 2022-01-12 ENCOUNTER — Emergency Department (HOSPITAL_BASED_OUTPATIENT_CLINIC_OR_DEPARTMENT_OTHER)
Admission: EM | Admit: 2022-01-12 | Discharge: 2022-01-13 | Disposition: A | Discharge status: Home / Self Care | Payer: Medicare Other | Attending: Emergency Medicine | Admitting: Emergency Medicine

## 2022-01-12 DIAGNOSIS — L03115 Cellulitis of right lower limb: Secondary | ICD-10-CM | POA: Diagnosis not present

## 2022-01-12 DIAGNOSIS — S80861A Insect bite (nonvenomous), right lower leg, initial encounter: Secondary | ICD-10-CM | POA: Diagnosis not present

## 2022-01-12 DIAGNOSIS — W57XXXA Bitten or stung by nonvenomous insect and other nonvenomous arthropods, initial encounter: Secondary | ICD-10-CM | POA: Diagnosis not present

## 2022-01-12 DIAGNOSIS — S90561A Insect bite (nonvenomous), right ankle, initial encounter: Secondary | ICD-10-CM | POA: Diagnosis not present

## 2022-01-12 NOTE — ED Triage Notes (Signed)
Pt suspects insect bite to right ankle. Noted small site, itching, swelling, and "pins" sensation starting yesterday after cutting his grass. Pt ambulatory in triage. Denies pain, stating "it just feels real tight." Denies recent injury. Has been icing site with little improvement.

## 2022-01-13 MED ORDER — CEPHALEXIN 500 MG PO CAPS: 500.0000 mg | ORAL_CAPSULE | Freq: Four times a day (QID) | ORAL | 0 refills | Status: DC

## 2022-01-13 MED ORDER — CEPHALEXIN 250 MG PO CAPS
500.0000 mg | ORAL_CAPSULE | Freq: Once | ORAL | Status: AC
  Administered 2022-01-13: 500 mg via ORAL
  Filled 2022-01-13: qty 2

## 2022-01-13 NOTE — ED Provider Notes (Signed)
  [facility] EMERGENCY DEPT Provider Note   CSN: [id number] Arrival date & time: 01/12/22  2105     History  Chief Complaint  Patient presents with   Insect Bite    Kevin Howell is a 66 y.o. male.  Patient is a 66 year old male presenting with complaints of right ankle pain and swelling.  He believes he was bitten 2 days ago while mowing the grass.  He denies any fevers or chills.  He denies any throat swelling or difficulty breathing.  There are no aggravating or alleviating factors.  He describes some burning to the area and feels as though the skin is "tight".  The history is provided by the patient.       Home Medications Prior to Admission medications   Medication Sig Start Date End Date Taking? Authorizing Provider  aspirin EC 325 MG tablet Take 325 mg by mouth daily as needed. For pain    [provider]  cetirizine (ZYRTEC) 10 MG tablet Take 1 tablet (10 mg total) by mouth daily. 09/17/21   Lurene Shadow, PA-C  clindamycin (CLEOCIN) 150 MG capsule Take 3 capsules (450 mg total) by mouth 3 (three) times daily. 02/09/21   Tilden Fossa, MD  erythromycin ophthalmic ointment Place a 1/2 inch ribbon of ointment into the lower Right eyelid 3 times daily for 5 days. 09/17/21   Lurene Shadow, PA-C  Olopatadine HCl 0.2 % SOLN Apply 1 drop to eye daily. 09/17/21   Lurene Shadow, PA-C      Allergies    Advil [ibuprofen]    Review of Systems   Review of Systems  All other systems reviewed and are negative.   Physical Exam Updated Vital Signs BP 127/86   Pulse 60   Temp 97.9 F (36.6 C) (Oral)   Resp 16   SpO2 100%  Physical Exam Vitals and nursing note reviewed.  Constitutional:      Appearance: Normal appearance.  HENT:     Head: Normocephalic and atraumatic.  Pulmonary:     Effort: Pulmonary effort is normal.  Skin:    General: Skin is warm and dry.     Comments: There is a small puncture wound to the lateral aspect of the right ankle,  presumably an insect sting/bite.  There is surrounding swelling, warmth, and erythema suggestive of cellulitis.  Neurological:     Mental Status: He is alert.     ED Results / Procedures / Treatments   Labs (all labs ordered are listed, but only abnormal results are displayed) Labs Reviewed - No data to display  EKG None  Radiology No results found.  Procedures Procedures    Medications Ordered in ED Medications - No data to display  ED Course/ Medical Decision Making/ A&P  Patient presenting with an insect sting to the right ankle that appears to be developing cellulitis.  This will be treated with Keflex and as needed return.  Final Clinical Impression(s) / ED Diagnoses Final diagnoses:  None    Rx / DC Orders ED Discharge Orders     None         Geoffery Lyons, MD 01/13/22 0040

## 2022-01-13 NOTE — ED Notes (Signed)
Pt agreeable with d/c plan as discussed by provider- this nurse has verbally reinforced d/c instructions and provided pt with written copy; pt acknowledges verbal understanding and denies any additional questions, concerns, needs- pt ambulatory independently with steady gait; vitals stable; no distress.

## 2022-01-13 NOTE — ED Notes (Signed)
Pt awake and alert lying in bed with provider at bedside.  RR even and unlabored on RA with symmetrical rise and fall of chest - no obvious acute distress noted.  RLE foot edema extending into R lower leg; hot to touch with very mild redness to lateral R ankle and small scab without drainage.  Pt reports R lower ankle/foot pain rated 2/10 described as tightness.  Distal neurovascular status intact.  Vitals stable.  Plan for PO ABX to be started during visit and pt will d/c home with prescription.  Pt agreeable with plan as discussed by Dr. Judd Lien.

## 2022-01-13 NOTE — Discharge Instructions (Signed)
Begin taking Keflex as prescribed.  Return to the emergency department for increased redness, pus draining from the wound, increased pain, high fever, or for other new and concerning symptoms.

## 2022-04-16 DIAGNOSIS — E039 Hypothyroidism, unspecified: Secondary | ICD-10-CM | POA: Diagnosis not present

## 2022-04-16 DIAGNOSIS — Z940 Kidney transplant status: Secondary | ICD-10-CM | POA: Diagnosis not present

## 2022-04-16 DIAGNOSIS — E0590 Thyrotoxicosis, unspecified without thyrotoxic crisis or storm: Secondary | ICD-10-CM | POA: Diagnosis not present

## 2022-04-16 DIAGNOSIS — N1831 Chronic kidney disease, stage 3a: Secondary | ICD-10-CM | POA: Diagnosis not present

## 2022-04-16 DIAGNOSIS — I10 Essential (primary) hypertension: Secondary | ICD-10-CM | POA: Diagnosis not present

## 2022-04-23 DIAGNOSIS — E782 Mixed hyperlipidemia: Secondary | ICD-10-CM | POA: Diagnosis not present

## 2022-04-23 DIAGNOSIS — E0590 Thyrotoxicosis, unspecified without thyrotoxic crisis or storm: Secondary | ICD-10-CM | POA: Diagnosis not present

## 2022-04-23 DIAGNOSIS — Z23 Encounter for immunization: Secondary | ICD-10-CM | POA: Diagnosis not present

## 2022-04-23 DIAGNOSIS — Z0000 Encounter for general adult medical examination without abnormal findings: Secondary | ICD-10-CM | POA: Diagnosis not present

## 2022-04-23 DIAGNOSIS — N1831 Chronic kidney disease, stage 3a: Secondary | ICD-10-CM | POA: Diagnosis not present

## 2022-04-23 DIAGNOSIS — I10 Essential (primary) hypertension: Secondary | ICD-10-CM | POA: Diagnosis not present

## 2023-01-31 IMAGING — CR DG HIP (WITH OR WITHOUT PELVIS) 2-3V*L*
3 series · 3 of 3 positions shown · non-contrast
Comparison: None.

CLINICAL DATA: Acute LEFT hip pain for 2 weeks. No known injury.
Initial encounter.

EXAM:
DG HIP (WITH OR WITHOUT PELVIS) 2-3V LEFT

[pelvis ap]
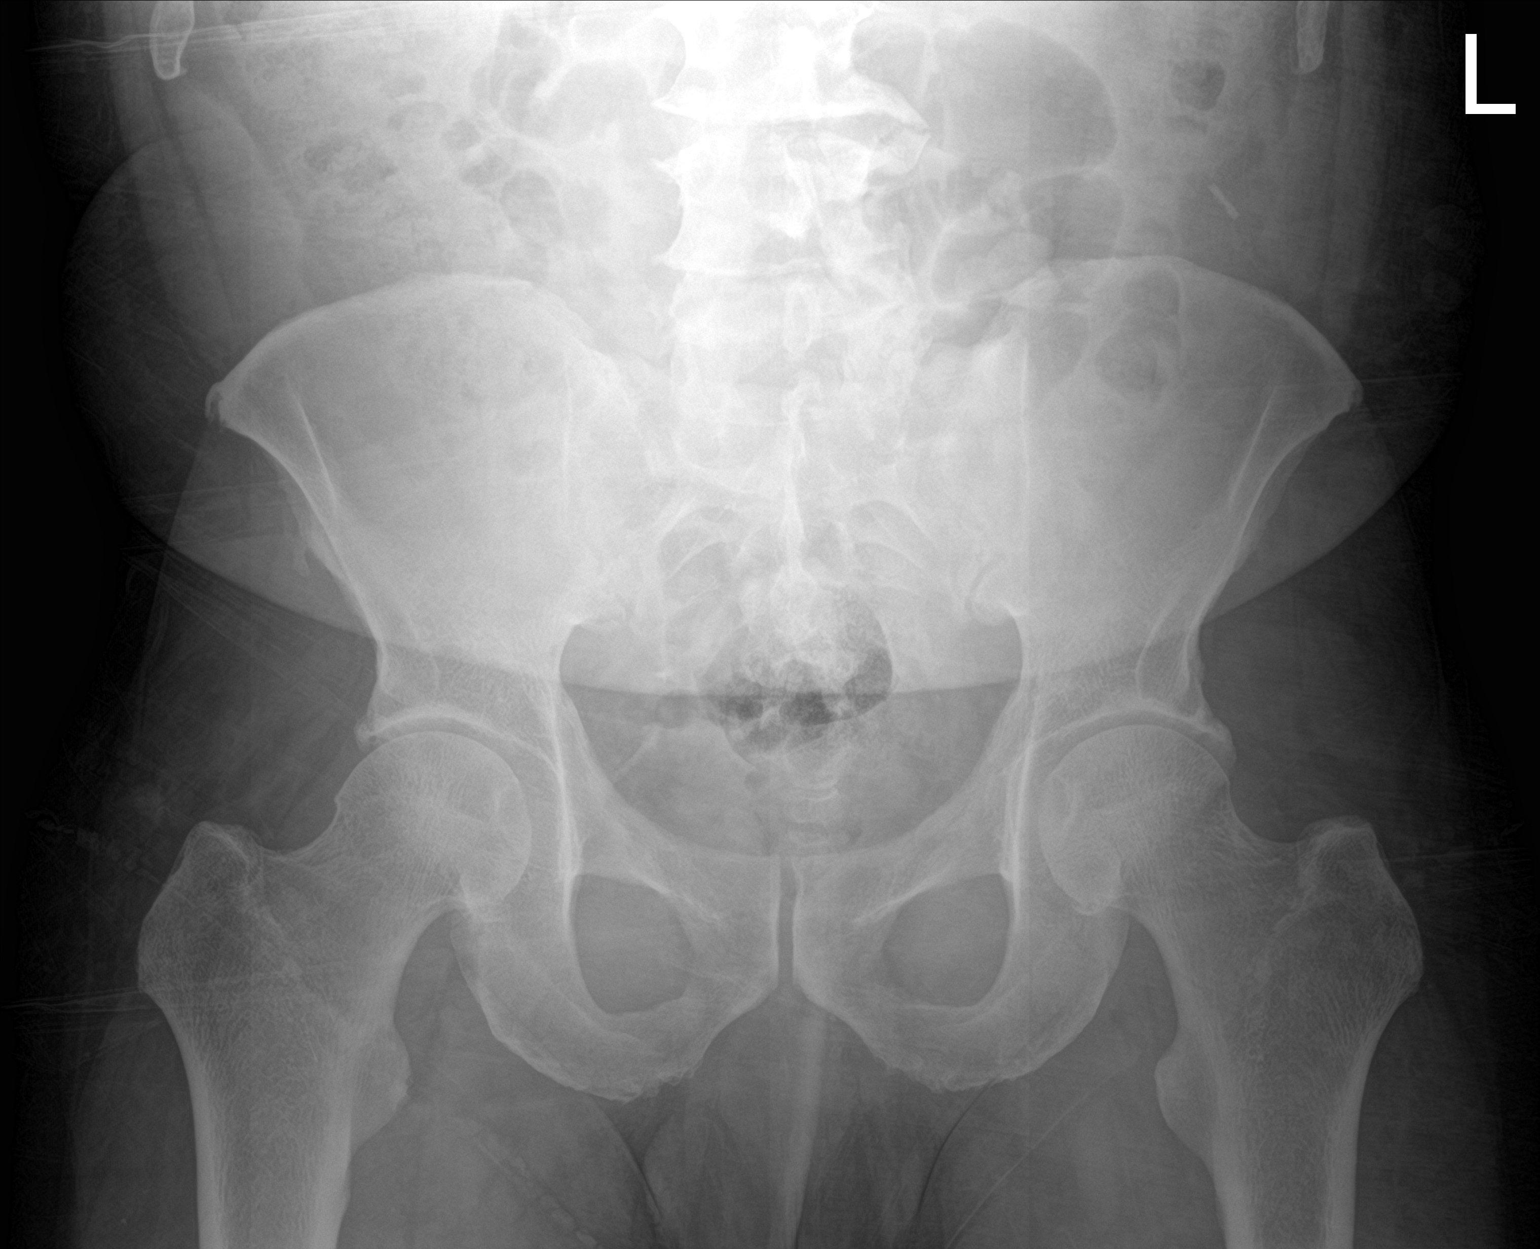

[hip ap]
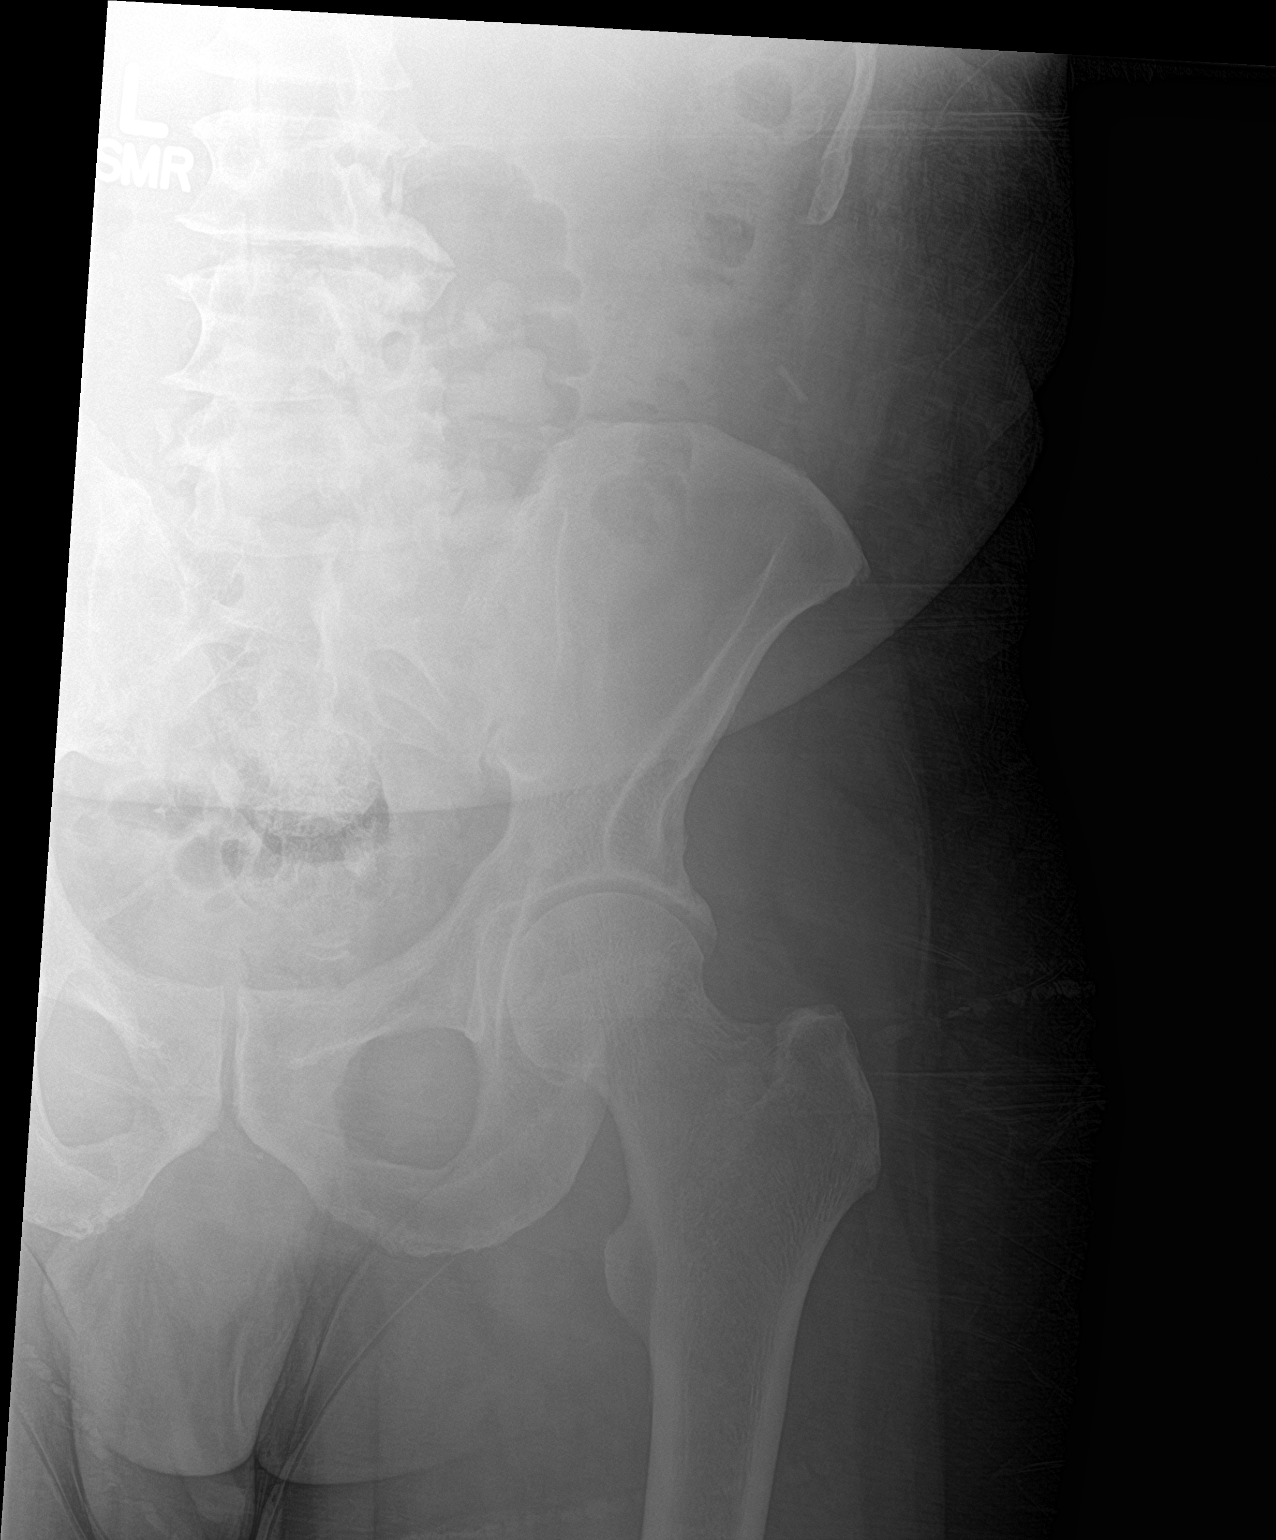

[hip lat]
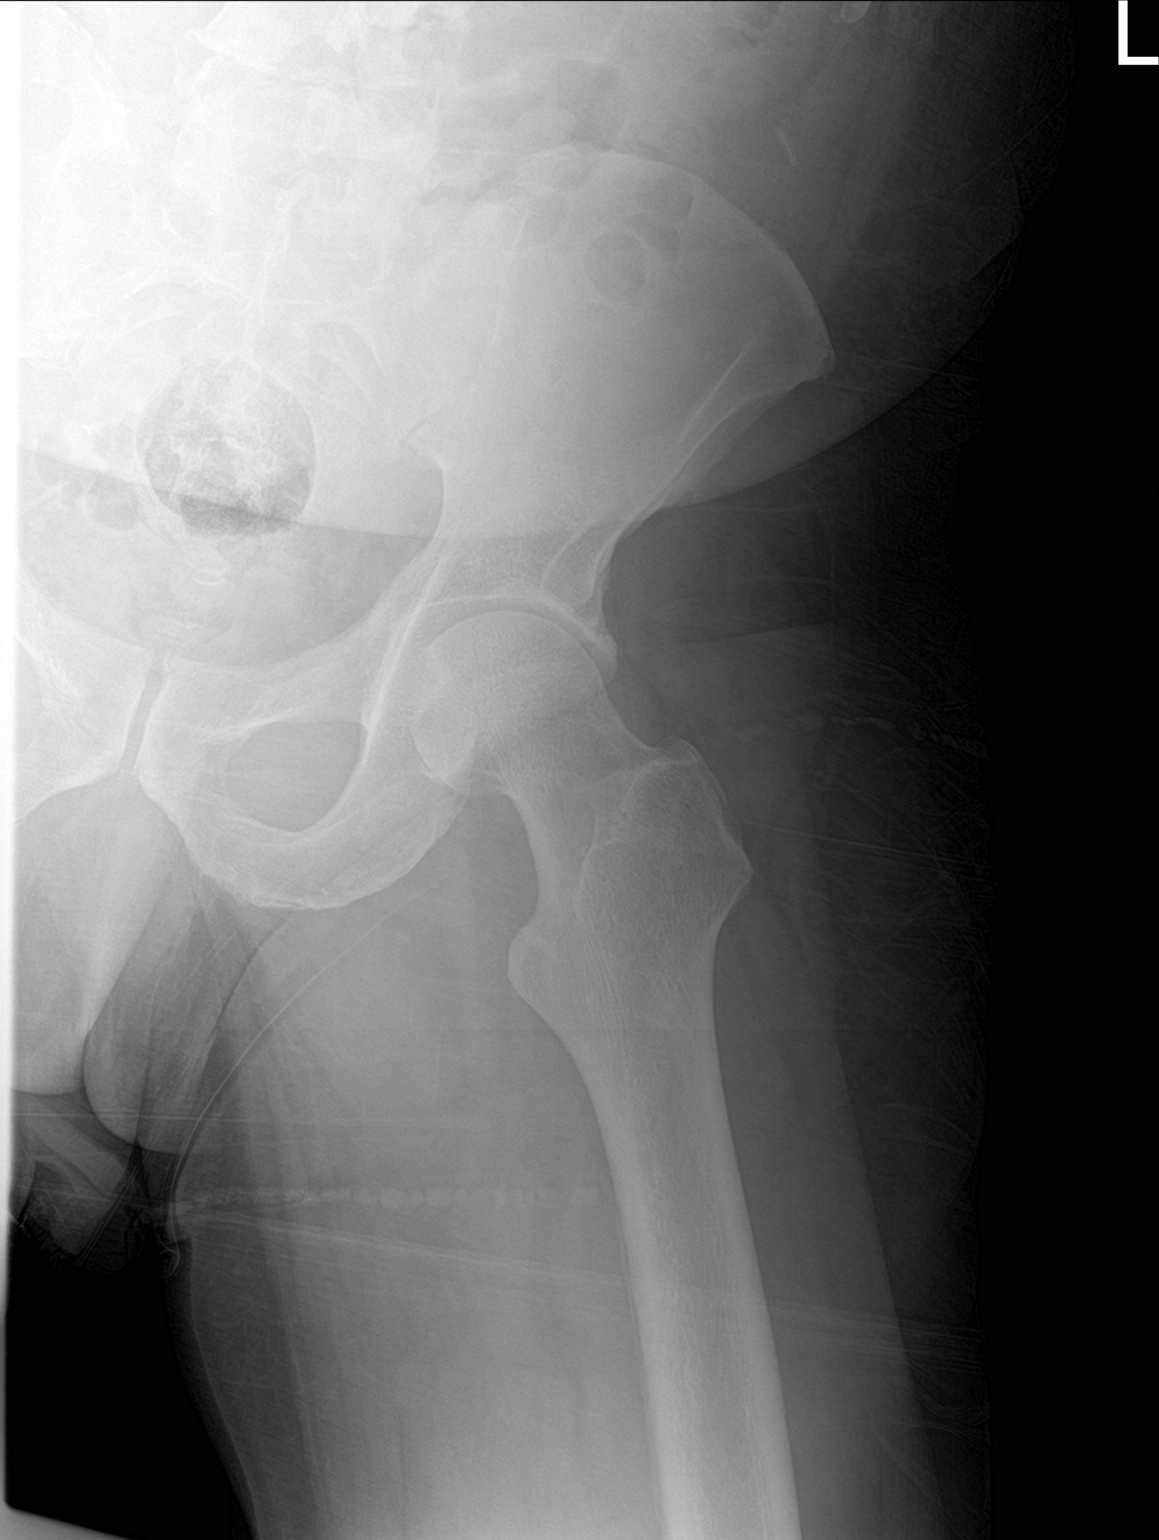

[3 of 3 positions shown; findings below may reference images not displayed]

FINDINGS: There is no evidence of acute fracture or subluxation.

Mild joint space narrowing noted.

No focal bony lesions are identified.
IMPRESSION: 1. No acute abnormality.
2. Mild degenerative changes.

## 2023-02-20 DIAGNOSIS — E782 Mixed hyperlipidemia: Secondary | ICD-10-CM | POA: Diagnosis not present

## 2023-02-26 DIAGNOSIS — E0590 Thyrotoxicosis, unspecified without thyrotoxic crisis or storm: Secondary | ICD-10-CM | POA: Diagnosis not present

## 2023-02-26 DIAGNOSIS — E782 Mixed hyperlipidemia: Secondary | ICD-10-CM | POA: Diagnosis not present

## 2023-02-26 DIAGNOSIS — N1831 Chronic kidney disease, stage 3a: Secondary | ICD-10-CM | POA: Diagnosis not present

## 2023-02-26 DIAGNOSIS — I10 Essential (primary) hypertension: Secondary | ICD-10-CM | POA: Diagnosis not present

## 2023-04-22 ENCOUNTER — Encounter (HOSPITAL_COMMUNITY): Payer: Self-pay

## 2023-04-22 ENCOUNTER — Ambulatory Visit (HOSPITAL_COMMUNITY)
Admission: EM | Admit: 2023-04-22 | Discharge: 2023-04-22 | Disposition: A | Discharge status: Home / Self Care | Payer: Medicare Other | Attending: Internal Medicine | Admitting: Internal Medicine

## 2023-04-22 DIAGNOSIS — J028 Acute pharyngitis due to other specified organisms: Secondary | ICD-10-CM | POA: Diagnosis not present

## 2023-04-22 DIAGNOSIS — Z1152 Encounter for screening for COVID-19: Secondary | ICD-10-CM | POA: Insufficient documentation

## 2023-04-22 DIAGNOSIS — B9789 Other viral agents as the cause of diseases classified elsewhere: Secondary | ICD-10-CM | POA: Insufficient documentation

## 2023-04-22 DIAGNOSIS — J029 Acute pharyngitis, unspecified: Secondary | ICD-10-CM | POA: Diagnosis not present

## 2023-04-22 MED ORDER — BENZONATATE 200 MG PO CAPS: 200.0000 mg | ORAL_CAPSULE | Freq: Three times a day (TID) | ORAL | 0 refills | Status: DC | PRN

## 2023-04-22 NOTE — ED Triage Notes (Signed)
Pt c/o cold, fever, sore throat, and congestion since x2 days. States took tylenol last night.

## 2023-04-22 NOTE — Discharge Instructions (Addendum)
Please maintain adequate hydration Will call you with recommendations if your lab results are abnormal Continue taking Tylenol as needed for body aches and/or fever Please take medications as prescribed Please return to urgent care if you have worsening symptoms.

## 2023-04-22 NOTE — ED Triage Notes (Signed)
Pt c/o fever, headache, cough, congestion since Friday. Pt has taken tylenol for symptoms

## 2023-04-23 DIAGNOSIS — N1831 Chronic kidney disease, stage 3a: Secondary | ICD-10-CM | POA: Diagnosis not present

## 2023-04-23 LAB — SARS CORONAVIRUS 2 (TAT 6-24 HRS): SARS Coronavirus 2: NEGATIVE

## 2023-04-23 NOTE — ED Provider Notes (Signed)
[facility]    CSN: [id number] Arrival date & time: 04/22/23  1901      History   Chief Complaint Chief Complaint  Patient presents with   Fever   Cough    HPI Kevin Howell is a 67 y.o. male comes to the urgent care with 4-day history of subjective fever, cough, chest congestion and headaches.  Patient's symptoms have been persistent.  No shortness of breath or wheezing.  He has developed some sore throat and nasal congestion over the past couple of days.  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  No nausea, vomiting or diarrhea.  Patient's cough is not productive but interferes with his sleep pattern.  No shortness of breath or chest tightness or wheezing.  Patient denies any tobacco use. HPI  History reviewed. No pertinent past medical history.  Patient Active Problem List   Diagnosis Date Noted   Lumbar stenosis with neurogenic claudication 03/04/2012    Past Surgical History:  Procedure Laterality Date   CERVICAL FUSION     4 YRS AGO   LUMBAR LAMINECTOMY/DECOMPRESSION MICRODISCECTOMY  03/04/2012   Procedure: LUMBAR LAMINECTOMY/DECOMPRESSION MICRODISCECTOMY 2 LEVELS;  Surgeon: Temple Pacini, MD;  Location: [facility];  Service: Neurosurgery;  Laterality: N/A;  Lumbar Laminotomies/Decompression Lumbar Three-Four, Lumbar Four-Five    NEPHRECTOMY     GIVEN T SISTER    TONSILLECTOMY     AGE 67        Home Medications    Prior to Admission medications   Medication Sig Start Date End Date Taking? Authorizing Provider  benzonatate (TESSALON) 200 MG capsule Take 1 capsule (200 mg total) by mouth 3 (three) times daily as needed for cough. 04/22/23  Yes Tamanika Heiney, Britta Mccreedy, MD  aspirin EC 325 MG tablet Take 325 mg by mouth daily as needed. For pain    [provider]  losartan (COZAAR) 25 MG tablet 1 tablet Orally Once a day for 30 days Patient not taking: Reported on 04/22/2023 02/26/23   [provider]  Olopatadine HCl 0.2 %  SOLN Apply 1 drop to eye daily. Patient not taking: Reported on 04/22/2023 09/17/21   Rolla Plate    Family History History reviewed. No pertinent family history.  Social History Social History   Tobacco Use   Smoking status: Never   Smokeless tobacco: Never  Vaping Use   Vaping status: Never Used  Substance Use Topics   Alcohol use: No   Drug use: No     Allergies   Advil [ibuprofen] and Tetanus-diphth-acell pertussis   Review of Systems Review of Systems As per HPI  Physical Exam Triage Vital Signs ED Triage Vitals  Encounter Vitals Group     BP 04/22/23 2001 (!) 126/94     Systolic BP Percentile --      Diastolic BP Percentile --      Pulse Rate 04/22/23 2001 88     Resp 04/22/23 2001 18     Temp 04/22/23 2001 99.4 F (37.4 C)     Temp Source 04/22/23 2001 Oral     SpO2 04/22/23 2001 99 %     Weight --      Height --      Head Circumference --      Peak Flow --      Pain Score 04/22/23 2002 3     Pain Loc --      Pain Education --  Exclude from Growth Chart --    No data found.  Updated Vital Signs BP 116/72 (BP Location: Left Arm)   Pulse 84   Temp 98.2 F (36.8 C) (Oral)   Resp 16   SpO2 95%   Visual Acuity Right Eye Distance:   Left Eye Distance:   Bilateral Distance:    Right Eye Near:   Left Eye Near:    Bilateral Near:     Physical Exam Vitals and nursing note reviewed.  Constitutional:      General: He is not in acute distress. HENT:     Mouth/Throat:     Mouth: Mucous membranes are moist.     Pharynx: Posterior oropharyngeal erythema present. No oropharyngeal exudate.  Eyes:     Extraocular Movements: Extraocular movements intact.     Pupils: Pupils are equal, round, and reactive to light.  Cardiovascular:     Rate and Rhythm: Normal rate and regular rhythm.     Pulses: Normal pulses.     Heart sounds: Normal heart sounds.  Pulmonary:     Effort: Pulmonary effort is normal.     Breath sounds: Normal breath  sounds.  Abdominal:     General: Bowel sounds are normal.     Palpations: Abdomen is soft.  Neurological:     Mental Status: He is alert.      UC Treatments / Results  Labs (all labs ordered are listed, but only abnormal results are displayed) Labs Reviewed  SARS CORONAVIRUS 2 (TAT 6-24 HRS)    EKG   Radiology No results found.  Procedures Procedures (including critical care time)  Medications Ordered in UC Medications - No data to display  Initial Impression / Assessment and Plan / UC Course  I have reviewed the triage vital signs and the nursing notes.  Pertinent labs & imaging results that were available during my care of the patient were reviewed by me and considered in my medical decision making (see chart for details).     1.  Acute viral pharyngitis: COVID-19 PCR test has been sent Tessalon Perles as needed for cough Lung exam is reassuring Tylenol or ibuprofen as needed for pain and/or fever Return precautions given  Final Clinical Impressions(s) / UC Diagnoses   Final diagnoses:  Acute viral pharyngitis     Discharge Instructions      Please maintain adequate hydration Will call you with recommendations if your lab results are abnormal Continue taking Tylenol as needed for body aches and/or fever Please take medications as prescribed Please return to urgent care if you have worsening symptoms.    ED Prescriptions     Medication Sig Dispense Auth. Provider   benzonatate (TESSALON) 200 MG capsule Take 1 capsule (200 mg total) by mouth 3 (three) times daily as needed for cough. 30 capsule Emanuela Runnion, Britta Mccreedy, MD      PDMP not reviewed this encounter.   Merrilee Jansky, MD 04/23/23 [phone]

## 2023-04-29 IMAGING — CT CT MAXILLOFACIAL W/O CM
3 series · 14 of 47 positions shown, 16 images · non-contrast
Comparison: None.

CLINICAL DATA: Mental status change, unknown cause. Swelling.
Additional provided: Facial swelling on right side (right eye and
upper lip). Intermittent confusion.

EXAM:
CT HEAD WITHOUT CONTRAST
CT MAXILLOFACIAL WITHOUT CONTRAST
TECHNIQUE: Multidetector CT imaging of the head and maxillofacial structures
were performed using the standard protocol without intravenous
contrast. Multiplanar CT image reconstructions of the maxillofacial
structures were also generated.

[Series 3: max soft · axial · 0.38mm/px · z∈[-300,-138]mm · 8 of 95 slices shown, 10 images]
[im 7/95  brain]
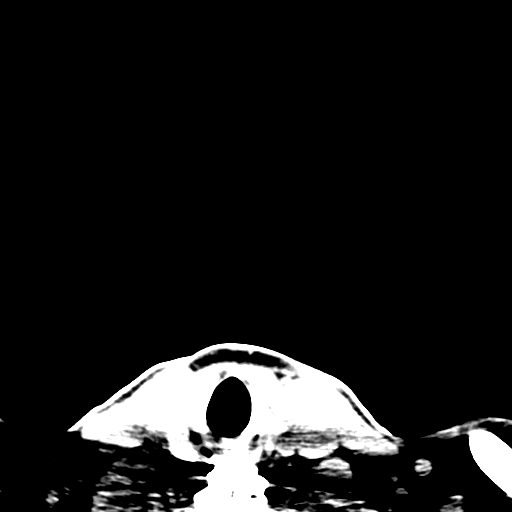
[im 7/95  bone]
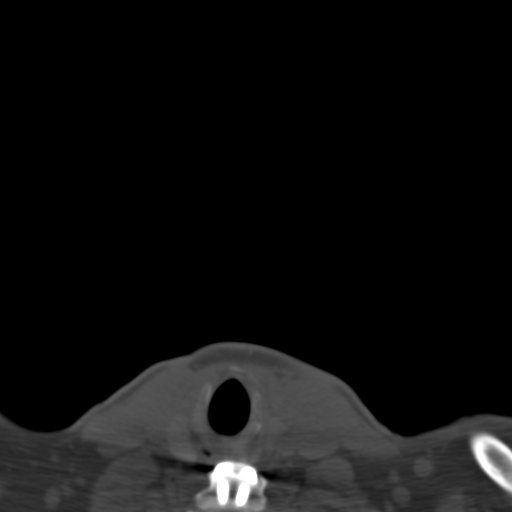
[im 20/95  bone]
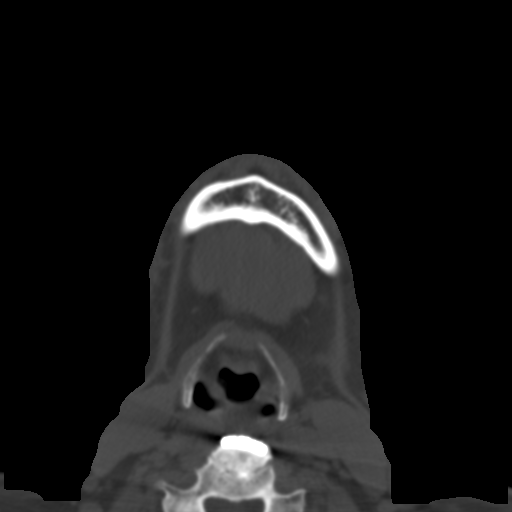
[im 30/95  bone]
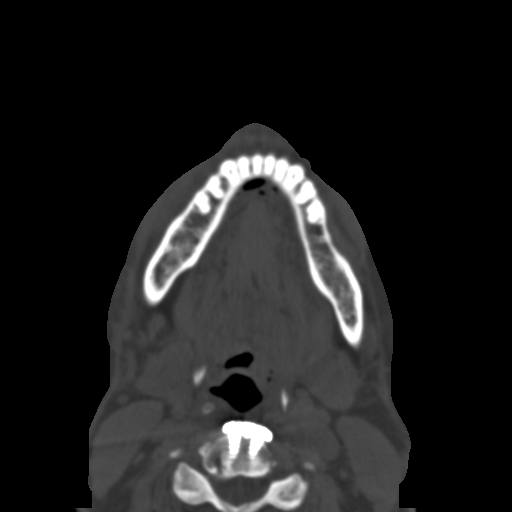
[im 43/95  bone]
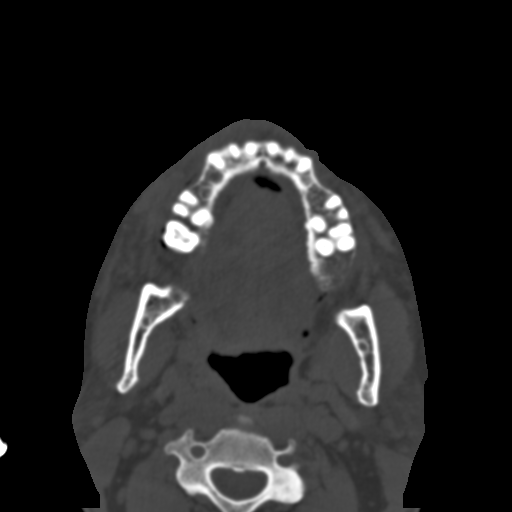
[im 52/95  brain]
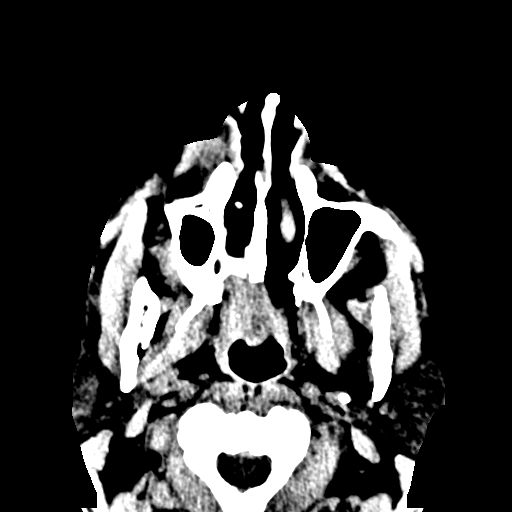
[im 52/95  bone]
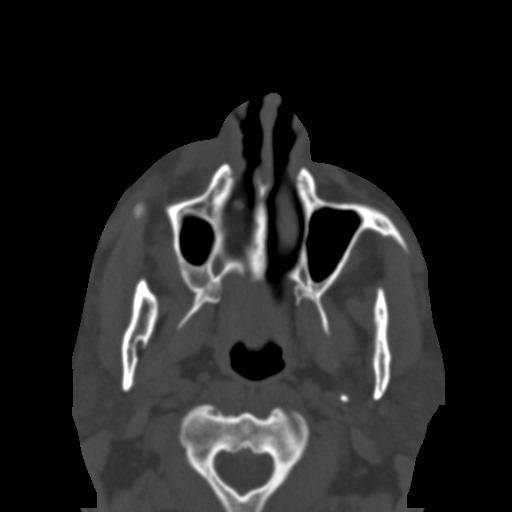
[im 65/95  bone]
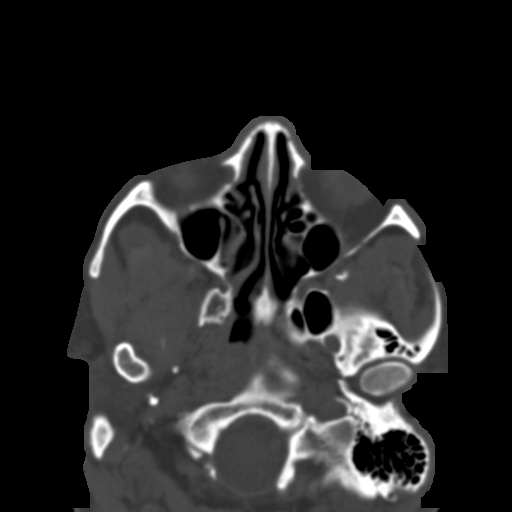
[im 75/95  bone]
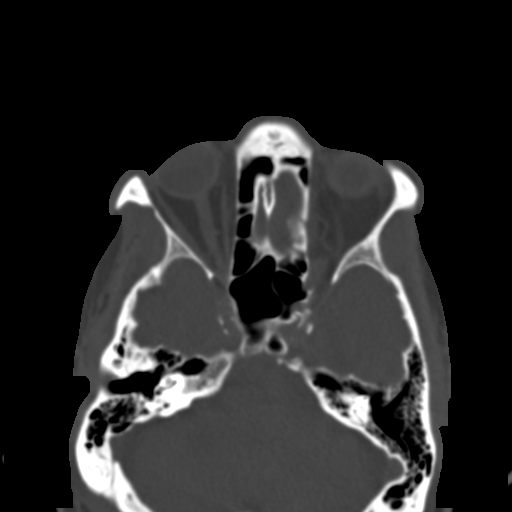
[im 88/95  bone]
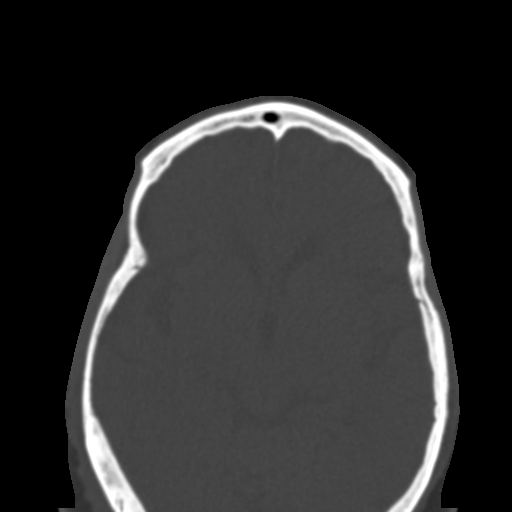

[Series 7: coronal soft · coronal · 0.37mm/px · 3 of 86 slices shown]
[im 29/86  bone]
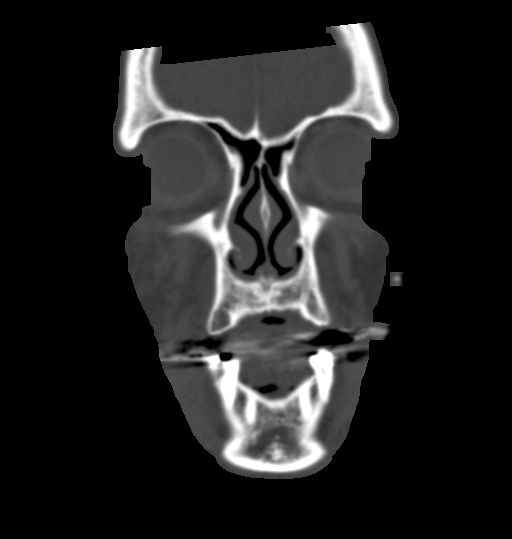
[im 38/86  bone]
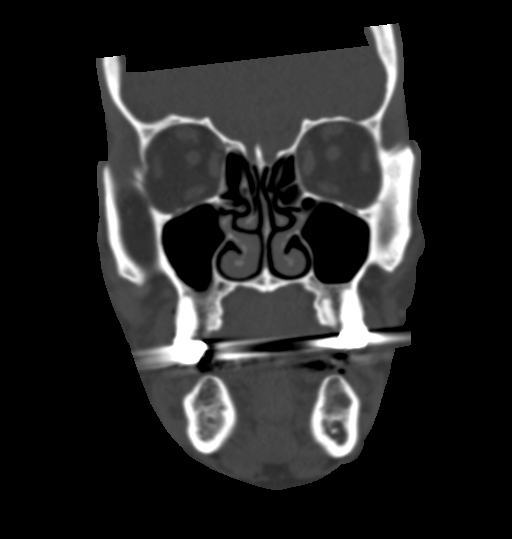
[im 48/86  bone]
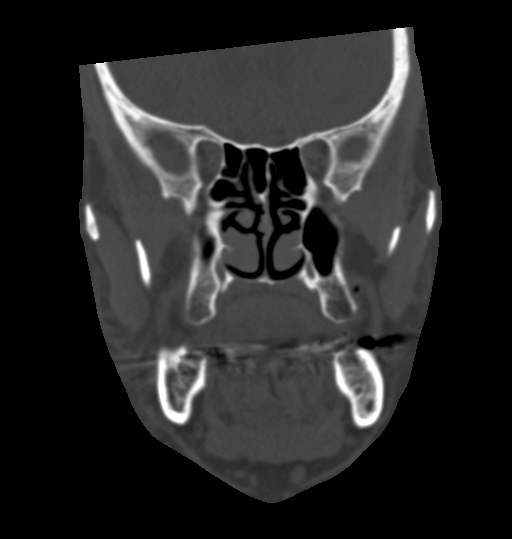

[Series 8: sagittal soft · sagittal · 0.35mm/px · 3 of 98 slices shown]
[im 33/98  bone]
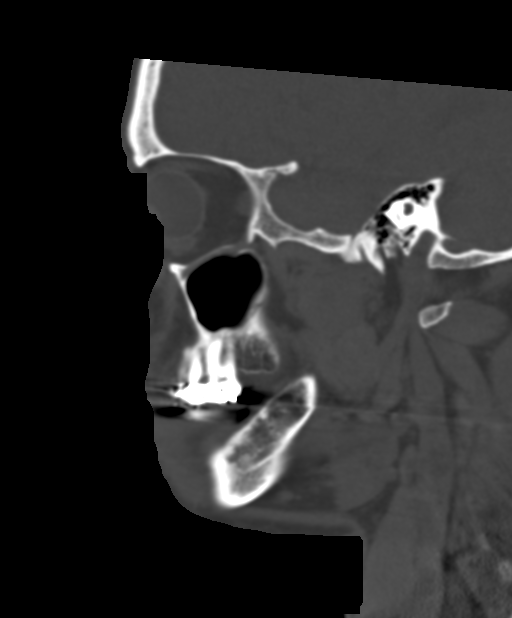
[im 49/98  bone]
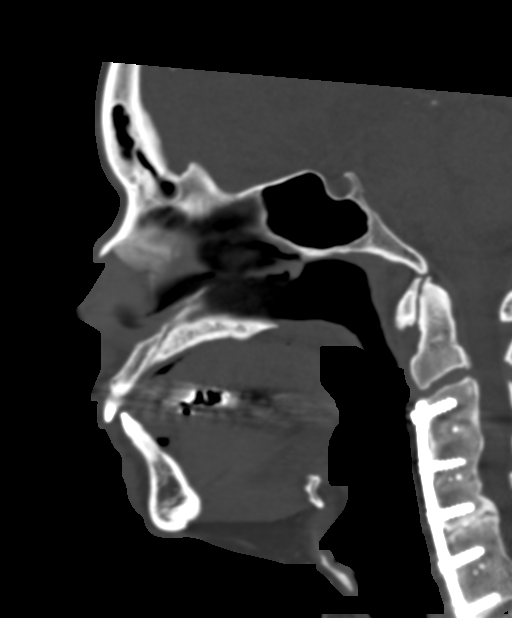
[im 65/98  bone]
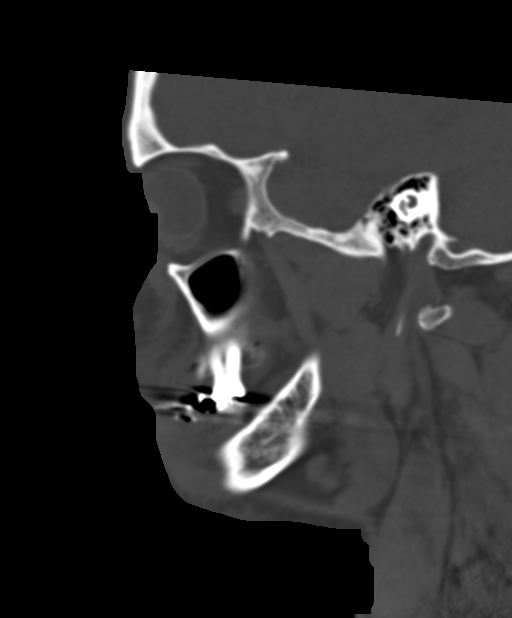

[14 of 47 positions shown; findings below may reference images not displayed]

FINDINGS: CT HEAD FINDINGS

Brain:

Cerebral volume is normal for age.

Mild patchy and ill-defined hypoattenuation within the cerebral
white matter, nonspecific but compatible with chronic small vessel
ischemic disease.

There is no acute intracranial hemorrhage.

No demarcated cortical infarct.

No extra-axial fluid collection.

No evidence of an intracranial mass.

No midline shift.

Vascular: No hyperdense vessel.  Atherosclerotic calcifications

Skull: Normal. Negative for fracture or focal lesion.

Other: No significant mastoid effusion.

CT MAXILLOFACIAL FINDINGS

Osseous: No maxillofacial fracture is identified. Periapical lucency
surrounds the right maxillary lateral incisor (series 4, image 52).
Periapical lucency also surrounds the right first molar (series 4,
image 49).

Orbits: No acute orbital finding. The globes are normal in size and
contour. The extraocular muscles and optic nerve sheath complexes
are symmetric and unremarkable.

Sinuses: Trace mucosal thickening and small mucous retention cysts
within the maxillary sinuses bilaterally. Trace mucosal thickening
within the right frontal sinus and bilateral ethmoid air cells.

Soft tissues: Swelling of the right maxillofacial and perimandibular
soft tissues with associated fat stranding. Additionally, there is a
region of induration measuring 2.4 x 2.3 cm in transaxial dimensions
along the paramedian anterior right maxillary alveolus and frontal
process of right maxilla, which may reflect phlegmon. No evidence of
abscess on this non-contrast examination.
IMPRESSION: CT head:

1. No evidence of acute intracranial abnormality.
2. Mild chronic small vessel ischemic changes within the cerebral
white matter.

CT maxillofacial:

1. Swelling of the right maxillofacial and perimandibular soft
tissues with associated fat stranding. Findings may reflect
cellulitis in the appropriate clinical setting. Consider an
odontogenic etiology (periapical lucency surrounds the right
maxillary lateral incisor and right maxillary first molar).
2. 2.4 x 2.3 cm region of soft tissue induration along the
paramedian anterior right maxillary alveolus and frontal process of
right maxilla, which may reflect phlegmon.
3. Mild paranasal sinus disease, as described.

## 2023-04-30 DIAGNOSIS — N1831 Chronic kidney disease, stage 3a: Secondary | ICD-10-CM | POA: Diagnosis not present

## 2023-04-30 DIAGNOSIS — E782 Mixed hyperlipidemia: Secondary | ICD-10-CM | POA: Diagnosis not present

## 2023-04-30 DIAGNOSIS — I10 Essential (primary) hypertension: Secondary | ICD-10-CM | POA: Diagnosis not present

## 2023-04-30 DIAGNOSIS — E0590 Thyrotoxicosis, unspecified without thyrotoxic crisis or storm: Secondary | ICD-10-CM | POA: Diagnosis not present

## 2023-08-06 DIAGNOSIS — N1831 Chronic kidney disease, stage 3a: Secondary | ICD-10-CM | POA: Diagnosis not present

## 2023-08-06 DIAGNOSIS — E0590 Thyrotoxicosis, unspecified without thyrotoxic crisis or storm: Secondary | ICD-10-CM | POA: Diagnosis not present

## 2023-08-06 DIAGNOSIS — E782 Mixed hyperlipidemia: Secondary | ICD-10-CM | POA: Diagnosis not present

## 2023-08-06 DIAGNOSIS — I10 Essential (primary) hypertension: Secondary | ICD-10-CM | POA: Diagnosis not present

## 2023-08-13 DIAGNOSIS — I10 Essential (primary) hypertension: Secondary | ICD-10-CM | POA: Diagnosis not present

## 2023-08-13 DIAGNOSIS — Z0000 Encounter for general adult medical examination without abnormal findings: Secondary | ICD-10-CM | POA: Diagnosis not present

## 2023-08-13 DIAGNOSIS — E782 Mixed hyperlipidemia: Secondary | ICD-10-CM | POA: Diagnosis not present

## 2023-08-13 DIAGNOSIS — N1831 Chronic kidney disease, stage 3a: Secondary | ICD-10-CM | POA: Diagnosis not present

## 2023-08-13 DIAGNOSIS — E0590 Thyrotoxicosis, unspecified without thyrotoxic crisis or storm: Secondary | ICD-10-CM | POA: Diagnosis not present

## 2024-01-08 ENCOUNTER — Other Ambulatory Visit: Payer: Self-pay

## 2024-01-08 ENCOUNTER — Encounter (HOSPITAL_BASED_OUTPATIENT_CLINIC_OR_DEPARTMENT_OTHER): Payer: Self-pay

## 2024-01-08 ENCOUNTER — Emergency Department (HOSPITAL_BASED_OUTPATIENT_CLINIC_OR_DEPARTMENT_OTHER)
Admission: EM | Admit: 2024-01-08 | Discharge: 2024-01-08 | Discharge status: Against Medical Advice | Attending: Emergency Medicine | Admitting: Emergency Medicine

## 2024-01-08 DIAGNOSIS — T63441A Toxic effect of venom of bees, accidental (unintentional), initial encounter: Secondary | ICD-10-CM | POA: Diagnosis not present

## 2024-01-08 DIAGNOSIS — Z5321 Procedure and treatment not carried out due to patient leaving prior to being seen by health care provider: Secondary | ICD-10-CM | POA: Insufficient documentation

## 2024-01-08 NOTE — ED Triage Notes (Signed)
 Patient states bee sting to foot last night at 7:30pm. Patient concerned because still swollen today.

## 2024-02-25 DIAGNOSIS — E782 Mixed hyperlipidemia: Secondary | ICD-10-CM | POA: Diagnosis not present

## 2024-02-25 DIAGNOSIS — E0590 Thyrotoxicosis, unspecified without thyrotoxic crisis or storm: Secondary | ICD-10-CM | POA: Diagnosis not present

## 2024-02-25 DIAGNOSIS — N1831 Chronic kidney disease, stage 3a: Secondary | ICD-10-CM | POA: Diagnosis not present

## 2024-02-25 DIAGNOSIS — I10 Essential (primary) hypertension: Secondary | ICD-10-CM | POA: Diagnosis not present

## 2024-03-06 DIAGNOSIS — I10 Essential (primary) hypertension: Secondary | ICD-10-CM | POA: Diagnosis not present

## 2024-03-06 DIAGNOSIS — N1831 Chronic kidney disease, stage 3a: Secondary | ICD-10-CM | POA: Diagnosis not present

## 2024-03-06 DIAGNOSIS — E782 Mixed hyperlipidemia: Secondary | ICD-10-CM | POA: Diagnosis not present

## 2024-03-06 DIAGNOSIS — E0590 Thyrotoxicosis, unspecified without thyrotoxic crisis or storm: Secondary | ICD-10-CM | POA: Diagnosis not present

## 2024-03-26 ENCOUNTER — Encounter: Payer: Self-pay | Admitting: Gastroenterology

## 2024-03-30 ENCOUNTER — Ambulatory Visit: Admitting: Gastroenterology

## 2024-03-30 ENCOUNTER — Encounter: Payer: Self-pay | Admitting: Gastroenterology

## 2024-03-30 VITALS — BP 110/80 | HR 80 | Ht 66.0 in | Wt 170.1 lb

## 2024-03-30 DIAGNOSIS — Z8042 Family history of malignant neoplasm of prostate: Secondary | ICD-10-CM

## 2024-03-30 DIAGNOSIS — Z860100 Personal history of colon polyps, unspecified: Secondary | ICD-10-CM

## 2024-03-30 MED ORDER — NA SULFATE-K SULFATE-MG SULF 17.5-3.13-1.6 GM/177ML PO SOLN: 1.0000 | Freq: Once | ORAL | 0 refills | Status: AC

## 2024-03-30 NOTE — Patient Instructions (Signed)
 You have been scheduled for a Colonoscopy. Please follow written instructions given to you at your visit today.   If you use inhalers (even only as needed), please bring them with you on the day of your procedure.  DO NOT TAKE 7 DAYS PRIOR TO TEST- Trulicity (dulaglutide) Ozempic, Wegovy (semaglutide) Mounjaro (tirzepatide) Bydureon Bcise (exanatide extended release)  DO NOT TAKE 1 DAY PRIOR TO YOUR TEST Rybelsus (semaglutide) Adlyxin (lixisenatide) Victoza (liraglutide) Byetta (exanatide) ___________________________________________________________________________  Please follow up sooner if symptoms increase or worsen   Due to recent changes in healthcare laws, you may see the results of your imaging and laboratory studies on MyChart before your provider has had a chance to review them.  We understand that in some cases there may be results that are confusing or concerning to you. Not all laboratory results come back in the same time frame and the provider may be waiting for multiple results in order to interpret others.  Please give us  48 hours in order for your provider to thoroughly review all the results before contacting the office for clarification of your results.   Thank you for trusting me with your gastrointestinal care!   Deanna May, NP _______________________________________________________  If your blood pressure at your visit was 140/90 or greater, please contact your primary care physician to follow up on this.  _______________________________________________________  If you are age 88 or older, your body mass index should be between 23-30. Your Body mass index is 27.46 kg/m. If this is out of the aforementioned range listed, please consider follow up with your Primary Care Provider.  If you are age 54 or younger, your body mass index should be between 19-25. Your Body mass index is 27.46 kg/m. If this is out of the aformentioned range listed, please consider follow  up with your Primary Care Provider.   ________________________________________________________  The [facility] providers would like to encourage you to use MYCHART to communicate with providers for non-urgent requests or questions.  Due to long hold times on the telephone, sending your provider a message by [facility] may be a faster and more efficient way to get a response.  Please allow 48 business hours for a response.  Please remember that this is for non-urgent requests.  _______________________________________________________

## 2024-03-30 NOTE — Progress Notes (Signed)
 Chief Complaint:Encounter for screening for malignant neoplasm of colon  Primary GI Doctor: Dr. Charlanne  HPI:  Patient is a  68  year old male patient with past medical history of BPH,CKD stage 3, hypertension, who was referred to me by Verdia Lombard, MD on 02/27/24 for a evaluation of encounter for screening for malignant neoplasm of colon.    Interval History    Patient presents to discuss setting up colonoscopy, history of colonic polyps.Patients last colonoscopy was 2013 and he had three colonic polyps with Dr. Luis. Reports recall was 3-5 years.Patient denies altered bowel habits, abdominal pain, or rectal bleeding. Denies GERD or dysphagia. Patient denies nausea, vomiting, or weight loss.Patients last EGD several years ago and per patient normal.   Denies alcohol. Nonsmoker.   Patient on ASA 325mg  po daily.  Patient's family history includes maternal grandmother with stomach CA  Wt Readings from Last 3 Encounters:  03/30/24 170 lb 2 oz (77.2 kg)  02/09/21 172 lb (78 kg)  02/19/12 167 lb 8 oz (76 kg)     Past Medical History:  Diagnosis Date   Allergies    Kidney donor     Past Surgical History:  Procedure Laterality Date   CERVICAL FUSION     4 YRS AGO   LUMBAR LAMINECTOMY/DECOMPRESSION MICRODISCECTOMY  03/04/2012   Procedure: LUMBAR LAMINECTOMY/DECOMPRESSION MICRODISCECTOMY 2 LEVELS;  Surgeon: Victory DELENA Gunnels, MD;  Location: [facility];  Service: Neurosurgery;  Laterality: N/A;  Lumbar Laminotomies/Decompression Lumbar Three-Four, Lumbar Four-Five    NEPHRECTOMY     GIVEN T SISTER    TONSILLECTOMY     AGE 48     Current Outpatient Medications  Medication Sig Dispense Refill   aspirin  EC 325 MG tablet Take 325 mg by mouth daily as needed. For pain     EPINEPHrine 0.3 mg/0.3 mL IJ SOAJ injection Inject 0.3 mg into the muscle as needed.     losartan (COZAAR) 25 MG tablet 1 tablet Orally Once a day for 30 days     Olopatadine  HCl 0.2 % SOLN Apply 1 drop to eye  daily. 2.5 mL 0   No current facility-administered medications for this visit.    Allergies as of 03/30/2024 - Review Complete 03/30/2024  Allergen Reaction Noted   Advil [ibuprofen]  02/19/2012   Tetanus-diphth-acell pertussis  10/23/2021    Family History  Problem Relation Age of Onset   Diabetes Mother    Prostate cancer Father    Bone cancer Father    Kidney failure Sister    Breast cancer Sister    Breast cancer Sister    Throat cancer Paternal Grandfather     Review of Systems:    Constitutional: No weight loss, fever, chills, weakness or fatigue HEENT: Eyes: No change in vision               Ears, Nose, Throat:  No change in hearing or congestion Skin: No rash or itching Cardiovascular: No chest pain, chest pressure or palpitations   Respiratory: No SOB or cough Gastrointestinal: See HPI and otherwise negative Genitourinary: No dysuria or change in urinary frequency Neurological: No headache, dizziness or syncope Musculoskeletal: No new muscle or joint pain Hematologic: No bleeding or bruising Psychiatric: No history of depression or anxiety    Physical Exam:  Vital signs: BP 110/80 (BP Location: Left Arm, Patient Position: Sitting, Cuff Size: Normal)   Pulse 80   Ht 5' 6 (1.676 m) Comment: height measured without shoes  Wt 170 lb  2 oz (77.2 kg)   BMI 27.46 kg/m   Constitutional:   Pleasant male appears to be in NAD, Well developed, Well nourished, alert and cooperative Throat: Oral cavity and pharynx without inflammation, swelling or lesion.  Respiratory: Respirations even and unlabored. Lungs clear to auscultation bilaterally.   No wheezes, crackles, or rhonchi.  Cardiovascular: Normal S1, S2. Regular rate and rhythm. No peripheral edema, cyanosis or pallor.  Gastrointestinal:  Soft, nondistended, nontender. No rebound or guarding. Normal bowel sounds. No appreciable masses or hepatomegaly. Rectal:  Not performed.  Msk:  Symmetrical without gross  deformities. Without edema, no deformity or joint abnormality.  Neurologic:  Alert and  oriented x4;  grossly normal neurologically.  Skin:   Dry and intact without significant lesions or rashes.  RELEVANT LABS AND IMAGING: CBC    Latest Ref Rng & Units 02/09/2021   11:20 AM 02/19/2012   12:52 PM 06/02/2009    4:35 AM  CBC  WBC 4.0 - 10.5 K/uL 12.3  6.7  6.2   Hemoglobin 13.0 - 17.0 g/dL 85.2  84.1  84.3   Hematocrit 39.0 - 52.0 % 45.6  46.4  47.3   Platelets 150 - 400 K/uL 207  180  164      CMP     Latest Ref Rng & Units 02/09/2021   11:20 AM 02/19/2012    8:51 AM 06/02/2009    4:35 AM  CMP  Glucose 70 - 99 mg/dL 95  90  92 SPECIMEN HEMOLYZED, GLUCOSE RESULTS [place] BE FALSELY ELEVATED   BUN 8 - 23 mg/dL 13  8  11    Creatinine 0.61 - 1.24 mg/dL 8.61  8.68  8.81   Sodium 135 - 145 mmol/L 137  137  136   Potassium 3.5 - 5.1 mmol/L 3.8  5.2  5.4 MODERATE HEMOLYSIS   Chloride 98 - 111 mmol/L 101  103  103   CO2 22 - 32 mmol/L 27  28  25    Calcium 8.9 - 10.3 mg/dL 9.5  9.7  9.2   Total Protein 6.5 - 8.1 g/dL 7.8     Total Bilirubin 0.3 - 1.2 mg/dL 1.7     Alkaline Phos 38 - 126 U/L 87     AST 15 - 41 U/L 13     ALT 0 - 44 U/L 18     02/25/24 labs show: BUN 11, creat 1.49, wbc 6.7, hgb 16.6, plt 226 08/07/23 lbs show: tsh 0.505  Assessment: Encounter Diagnosis  Name Primary?   History of colonic polyps Yes  68 year old male patient who presents for colon screening colonoscopy with Dr. Charlanne in [facility] for history of colonic polyps. No current GI issues.   Plan: -Schedule for a colonoscopy in [facility] with Dr. Charlanne. The risks and benefits of colonoscopy with possible polypectomy / biopsies were discussed and the patient agrees to proceed.  - request records from colonoscopy in 2013 for records   Thank you for the courtesy of this consult. Please call me with any questions or concerns.   Sharen Youngren, FNP-C [facility] 03/30/2024, 9:03 AM  Cc: Verdia Lombard, MD

## 2024-05-14 ENCOUNTER — Encounter: Payer: Self-pay | Admitting: Gastroenterology

## 2024-05-22 ENCOUNTER — Encounter: Payer: Self-pay | Admitting: Gastroenterology

## 2024-05-22 ENCOUNTER — Ambulatory Visit: Admitting: Gastroenterology

## 2024-05-22 VITALS — BP 107/67 | HR 52 | Temp 97.8°F | Resp 13 | Ht 66.0 in | Wt 170.0 lb

## 2024-05-22 DIAGNOSIS — Z1211 Encounter for screening for malignant neoplasm of colon: Secondary | ICD-10-CM

## 2024-05-22 DIAGNOSIS — K640 First degree hemorrhoids: Secondary | ICD-10-CM

## 2024-05-22 DIAGNOSIS — Z800 Family history of malignant neoplasm of digestive organs: Secondary | ICD-10-CM

## 2024-05-22 DIAGNOSIS — D127 Benign neoplasm of rectosigmoid junction: Secondary | ICD-10-CM

## 2024-05-22 DIAGNOSIS — D124 Benign neoplasm of descending colon: Secondary | ICD-10-CM

## 2024-05-22 DIAGNOSIS — Z860100 Personal history of colon polyps, unspecified: Secondary | ICD-10-CM | POA: Diagnosis not present

## 2024-05-22 DIAGNOSIS — K5730 Diverticulosis of large intestine without perforation or abscess without bleeding: Secondary | ICD-10-CM

## 2024-05-22 MED ORDER — SODIUM CHLORIDE 0.9 % IV SOLN: 500.0000 mL | Freq: Once | INTRAVENOUS | Status: DC

## 2024-05-22 NOTE — Op Note (Signed)
 [facility] Patient Name: Kevin Howell Procedure Date: 05/22/2024 8:23 AM MRN: [id number] Endoscopist: Lynnie Bring , MD, [id number] Age: 68 Referring MD:  Date of Birth: [birth year] Gender: Male Account #: [id number] Procedure:                Colonoscopy Indications:              High risk colon cancer surveillance: Personal                            history of colonic polyps Medicines:                Monitored Anesthesia Care Procedure:                Pre-Anesthesia Assessment:                           - Prior to the procedure, a History and Physical                            was performed, and patient medications and                            allergies were reviewed. The patient's tolerance of                            previous anesthesia was also reviewed. The risks                            and benefits of the procedure and the sedation                            options and risks were discussed with the patient.                            All questions were answered, and informed consent                            was obtained. Prior Anticoagulants: The patient has                            taken no anticoagulant or antiplatelet agents. ASA                            Grade Assessment: II - A patient with mild systemic                            disease. After reviewing the risks and benefits,                            the patient was deemed in satisfactory condition to                            undergo the procedure.  After obtaining informed consent, the colonoscope                            was passed under direct vision. Throughout the                            procedure, the patient's blood pressure, pulse, and                            oxygen saturations were monitored continuously. The                            Olympus Scope CF [id number] was introduced through                            the anus and advanced to the the  cecum, identified                            by appendiceal orifice and ileocecal valve. The                            colonoscopy was performed without difficulty. The                            patient tolerated the procedure well. The quality                            of the bowel preparation was adequate to identify                            polyps. The ileocecal valve, appendiceal orifice,                            and rectum were photographed. Scope In: 8:35:56 AM Scope Out: 8:52:55 AM Scope Withdrawal Time: 0 hours 14 minutes 42 seconds  Total Procedure Duration: 0 hours 16 minutes 59 seconds  Findings:                 Two sessile polyps were found in the recto-sigmoid                            colon and mid descending colon. The polyps were 4                            to 6 mm in size. These polyps were removed with a                            cold snare. Resection and retrieval were complete.                           A few small-mouthed diverticula were found in the  sigmoid colon.                           Non-bleeding internal hemorrhoids were found during                            retroflexion. The hemorrhoids were small and Grade                            I (internal hemorrhoids that do not prolapse).                           Retroflexion in the right colon was performed.                           The exam was otherwise without abnormality on                            direct and retroflexion views. Complications:            No immediate complications. Estimated Blood Loss:     Estimated blood loss: none. Impression:               - Two 4 to 6 mm polyps at the recto-sigmoid colon                            and in the mid descending colon, removed with a                            cold snare. Resected and retrieved.                           - Mild sigmoid diverticulosis.                           - Non-bleeding internal hemorrhoids.                            - The examination was otherwise normal on direct                            and retroflexion views. Recommendation:           - Patient has a contact number available for                            emergencies. The signs and symptoms of potential                            delayed complications were discussed with the                            patient. Return to normal activities tomorrow.                            Written discharge instructions were provided to the  patient.                           - Resume previous high-fiber diet.                           - Continue present medications.                           - Await pathology results.                           - Repeat colonoscopy for surveillance based on                            pathology results.                           - The findings and recommendations were discussed                            with the patient's daughter. Lynnie Bring, MD 05/22/2024 8:56:41 AM This report has been signed electronically.

## 2024-05-22 NOTE — Progress Notes (Signed)
 Called to room to assist during endoscopic procedure.  Patient ID and intended procedure confirmed with present staff. Received instructions for my participation in the procedure from the performing physician.

## 2024-05-22 NOTE — Progress Notes (Signed)
 Chief Complaint:Encounter for screening for malignant neoplasm of colon  Primary GI Doctor: Dr. Charlanne   HPI:  Patient is a  68  year old male patient with past medical history of BPH,CKD stage 3, hypertension, who was referred to me by Verdia Lombard, MD on 02/27/24 for a evaluation of encounter for screening for malignant neoplasm of colon.     Interval History    Patient presents to discuss setting up colonoscopy, history of colonic polyps.Patients last colonoscopy was 2013 and he had three colonic polyps with Dr. Luis. Reports recall was 3-5 years.Patient denies altered bowel habits, abdominal pain, or rectal bleeding. Denies GERD or dysphagia. Patient denies nausea, vomiting, or weight loss.Patients last EGD several years ago and per patient normal.    Denies alcohol. Nonsmoker.    Patient on ASA 325mg  po daily.   Patient's family history includes maternal grandmother with stomach CA      Wt Readings from Last 3 Encounters:  03/30/24 170 lb 2 oz (77.2 kg)  02/09/21 172 lb (78 kg)  02/19/12 167 lb 8 oz (76 kg)          Past Medical History:  Diagnosis Date   Allergies     Kidney donor                 Past Surgical History:  Procedure Laterality Date   CERVICAL FUSION        4 YRS AGO   LUMBAR LAMINECTOMY/DECOMPRESSION MICRODISCECTOMY   03/04/2012    Procedure: LUMBAR LAMINECTOMY/DECOMPRESSION MICRODISCECTOMY 2 LEVELS;  Surgeon: Victory DELENA Gunnels, MD;  Location: [facility];  Service: Neurosurgery;  Laterality: N/A;  Lumbar Laminotomies/Decompression Lumbar Three-Four, Lumbar Four-Five    NEPHRECTOMY        GIVEN T SISTER    TONSILLECTOMY        AGE 3                 Current Outpatient Medications  Medication Sig Dispense Refill   aspirin  EC 325 MG tablet Take 325 mg by mouth daily as needed. For pain       EPINEPHrine 0.3 mg/0.3 mL IJ SOAJ injection Inject 0.3 mg into the muscle as needed.       losartan (COZAAR) 25 MG tablet 1 tablet Orally Once a day  for 30 days       Olopatadine  HCl 0.2 % SOLN Apply 1 drop to eye daily. 2.5 mL 0      No current facility-administered medications for this visit.             Allergies as of 03/30/2024 - Review Complete 03/30/2024  Allergen Reaction Noted   Advil [ibuprofen]   02/19/2012   Tetanus-diphth-acell pertussis   10/23/2021           Family History  Problem Relation Age of Onset   Diabetes Mother     Prostate cancer Father     Bone cancer Father     Kidney failure Sister     Breast cancer Sister     Breast cancer Sister     Throat cancer Paternal Grandfather            Review of Systems:    Constitutional: No weight loss, fever, chills, weakness or fatigue HEENT: Eyes: No change in vision               Ears, Nose, Throat:  No change in hearing or congestion Skin: No rash or itching Cardiovascular: No chest  pain, chest pressure or palpitations   Respiratory: No SOB or cough Gastrointestinal: See HPI and otherwise negative Genitourinary: No dysuria or change in urinary frequency Neurological: No headache, dizziness or syncope Musculoskeletal: No new muscle or joint pain Hematologic: No bleeding or bruising Psychiatric: No history of depression or anxiety      Physical Exam:  Vital signs: BP 110/80 (BP Location: Left Arm, Patient Position: Sitting, Cuff Size: Normal)   Pulse 80   Ht 5' 6 (1.676 m) Comment: height measured without shoes  Wt 170 lb 2 oz (77.2 kg)   BMI 27.46 kg/m    Constitutional:   Pleasant male appears to be in NAD, Well developed, Well nourished, alert and cooperative Throat: Oral cavity and pharynx without inflammation, swelling or lesion.  Respiratory: Respirations even and unlabored. Lungs clear to auscultation bilaterally.   No wheezes, crackles, or rhonchi.  Cardiovascular: Normal S1, S2. Regular rate and rhythm. No peripheral edema, cyanosis or pallor.  Gastrointestinal:  Soft, nondistended, nontender. No rebound or guarding. Normal bowel  sounds. No appreciable masses or hepatomegaly. Rectal:  Not performed.  Msk:  Symmetrical without gross deformities. Without edema, no deformity or joint abnormality.  Neurologic:  Alert and  oriented x4;  grossly normal neurologically.  Skin:   Dry and intact without significant lesions or rashes.   RELEVANT LABS AND IMAGING: CBC     Latest Ref Rng & Units 02/09/2021   11:20 AM 02/19/2012   12:52 PM 06/02/2009    4:35 AM  CBC  WBC 4.0 - 10.5 K/uL 12.3  6.7  6.2   Hemoglobin 13.0 - 17.0 g/dL 85.2  84.1  84.3   Hematocrit 39.0 - 52.0 % 45.6  46.4  47.3   Platelets 150 - 400 K/uL 207  180  164       CMP         Latest Ref Rng & Units 02/09/2021   11:20 AM 02/19/2012    8:51 AM 06/02/2009    4:35 AM  CMP  Glucose 70 - 99 mg/dL 95  90  92 SPECIMEN HEMOLYZED, GLUCOSE RESULTS MAY BE FALSELY ELEVATED   BUN 8 - 23 mg/dL 13  8  11    Creatinine 0.61 - 1.24 mg/dL 8.61  8.68  8.81   Sodium 135 - 145 mmol/L 137  137  136   Potassium 3.5 - 5.1 mmol/L 3.8  5.2  5.4 MODERATE HEMOLYSIS   Chloride 98 - 111 mmol/L 101  103  103   CO2 22 - 32 mmol/L 27  28  25    Calcium 8.9 - 10.3 mg/dL 9.5  9.7  9.2   Total Protein 6.5 - 8.1 g/dL 7.8       Total Bilirubin 0.3 - 1.2 mg/dL 1.7       Alkaline Phos 38 - 126 U/L 87       AST 15 - 41 U/L 13       ALT 0 - 44 U/L 18       02/25/24 labs show: BUN 11, creat 1.49, wbc 6.7, hgb 16.6, plt 226 08/07/23 lbs show: tsh 0.505   Assessment:     Encounter Diagnosis  Name Primary?   History of colonic polyps Yes  68 year old male patient who presents for colon screening colonoscopy with Dr. Charlanne in [facility] for history of colonic polyps. No current GI issues.    Plan: -Schedule for a colonoscopy in [facility] with Dr. Charlanne. The risks and benefits of colonoscopy with possible  polypectomy / biopsies were discussed and the patient agrees to proceed.  - request records from colonoscopy in 2013 for records     Thank you for the courtesy of this consult. Please call me with  any questions or concerns.    Deanna May, FNP-C    Attending physician's note   I have taken history, reviewed the chart and examined the patient. I performed a substantive portion of this encounter, including complete performance of at least one of the key components, in conjunction with the APP. I agree with the Advanced Practitioner's note, impression and recommendations.   Colon today    Anselm Bring, MD [facility] [phone]

## 2024-05-22 NOTE — Patient Instructions (Signed)

## 2024-05-22 NOTE — Progress Notes (Signed)
 Transferred to PACU via stretcher.  Not responding to stimulation at this time.  VSS upon leaving procedure room.

## 2024-05-25 ENCOUNTER — Telehealth: Payer: Self-pay

## 2024-05-25 NOTE — Telephone Encounter (Signed)
 Attempted to reach patient for follow up phone call. No answer, left voicemail to contact Dr. Hobert Lull office with any questions or concerns.

## 2024-05-26 LAB — SURGICAL PATHOLOGY

## 2024-05-31 ENCOUNTER — Ambulatory Visit: Payer: Self-pay | Admitting: Gastroenterology
